# Patient Record
Sex: Male | Born: 1950 | Race: White | Hispanic: No
Health system: Southern US, Community
[De-identification: ages and names within clinical notes are randomized; demographics above are authoritative.]

## PROBLEM LIST (undated history)

## (undated) DIAGNOSIS — M810 Age-related osteoporosis without current pathological fracture: Secondary | ICD-10-CM

## (undated) DIAGNOSIS — I499 Cardiac arrhythmia, unspecified: Secondary | ICD-10-CM

## (undated) DIAGNOSIS — C4490 Unspecified malignant neoplasm of skin, unspecified: Secondary | ICD-10-CM

## (undated) HISTORY — PX: TEAR DUCT PROBING: SHX793

---

## 1990-10-06 HISTORY — PX: HERNIA REPAIR: SHX51

## 2020-06-07 ENCOUNTER — Other Ambulatory Visit: Payer: Self-pay | Admitting: Internal Medicine

## 2020-06-07 DIAGNOSIS — Z136 Encounter for screening for cardiovascular disorders: Secondary | ICD-10-CM

## 2020-06-20 ENCOUNTER — Other Ambulatory Visit: Payer: Self-pay

## 2020-06-20 ENCOUNTER — Ambulatory Visit
Admission: RE | Admit: 2020-06-20 | Discharge: 2020-06-20 | Disposition: A | Discharge status: Home / Self Care | Payer: Medicare Other | Source: Ambulatory Visit | Attending: Internal Medicine | Admitting: Internal Medicine

## 2020-06-20 DIAGNOSIS — Z7689 Persons encountering health services in other specified circumstances: Secondary | ICD-10-CM | POA: Insufficient documentation

## 2020-06-20 DIAGNOSIS — Z87891 Personal history of nicotine dependence: Secondary | ICD-10-CM | POA: Insufficient documentation

## 2020-06-20 DIAGNOSIS — Z136 Encounter for screening for cardiovascular disorders: Secondary | ICD-10-CM | POA: Diagnosis not present

## 2020-07-19 ENCOUNTER — Telehealth: Payer: Self-pay

## 2020-07-19 NOTE — Telephone Encounter (Signed)
Contacted patient for lung CT screening clinic based on referral from Dr. Nemiah Commander.  I explained program to patient and he is agreeable.  We discussed smoking history and he has only ever smoked cigars.  He started smoking cigars when he was around age 69 and smokes intermittently, more during the summer months.  During the warm months (he said 5 to 7 months) he will smoke 1 to 5 cigars a week.  During the rest of the months (the cold months) he does not smoke.  I have explained that I am not sure if he qualifies for the program as it may not encompass cigar smoking, only cigarette smoking.  I have sent my question to Glenna Fellows, lung navigator and asked him to call patient to clarify eligibility.  I have scheduled his CT scan for Nov 11 at 10:00 am and given him address to imaging center and phone number to Glenna Fellows, lung navigator.

## 2020-07-20 NOTE — Telephone Encounter (Signed)
Patient contacted and notified that current eligibility for lung screening is only for cigarette smoking history. Patient verbalizes understanding.

## 2021-10-08 IMAGING — US US ABDOMINAL AORTA SCREENING AAA
1 series · 15 of 25 positions shown · non-contrast
Comparison: None.

CLINICAL DATA: Male between 65-75 years of age with a smoking
history.

EXAM:
US ABDOMINAL AORTA MEDICARE SCREENING
TECHNIQUE: Ultrasound examination of the abdominal aorta was performed as a
screening evaluation for abdominal aortic aneurysm.

[Series 1: us abdominal aorta screening aaa · 15 of 32 slices shown]
[im 1/32]
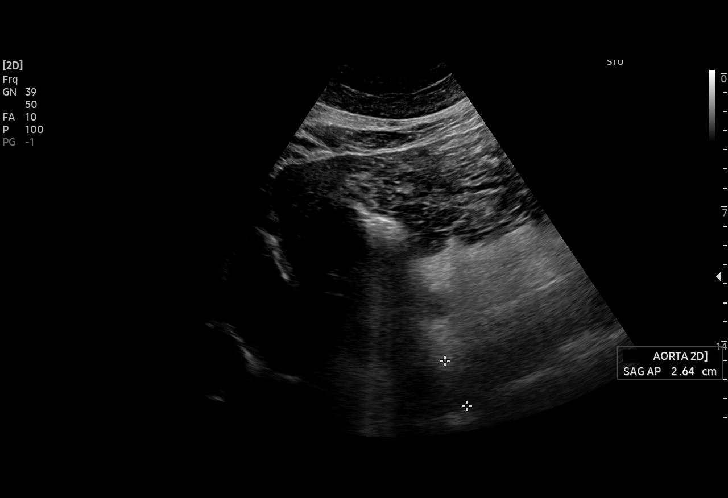
[im 3/32]
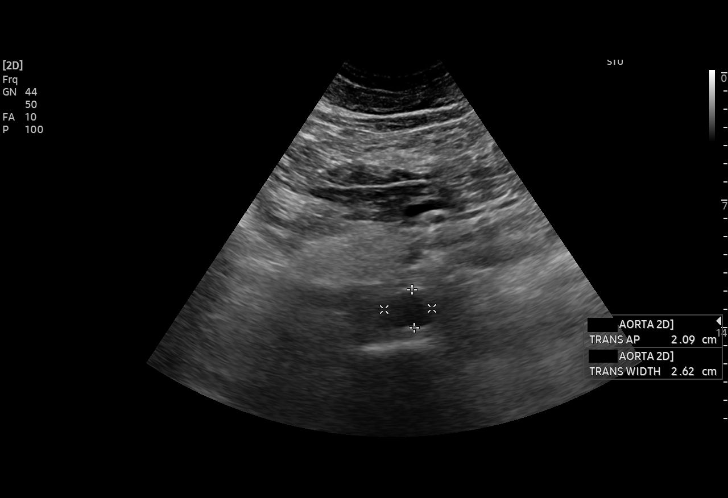
[im 6/32]
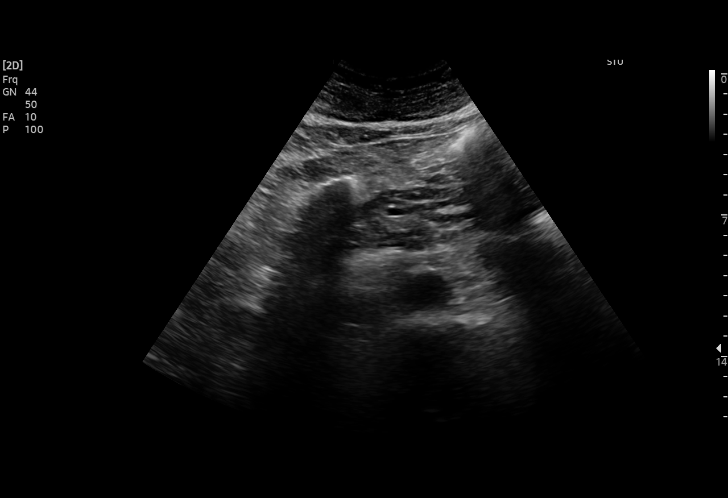
[im 7/32]
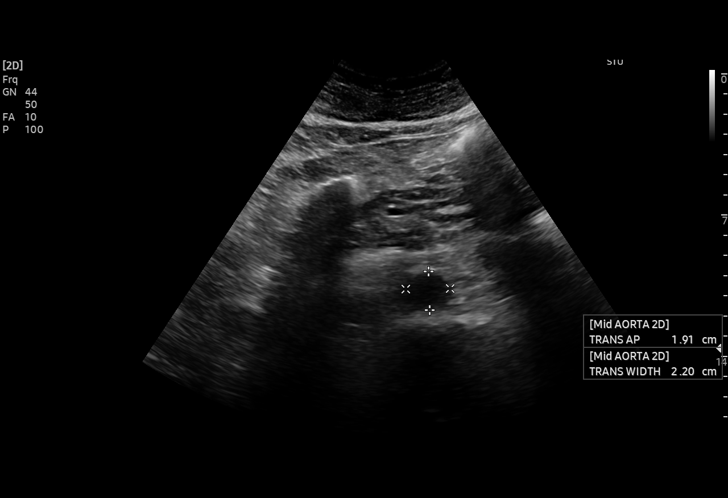
[im 10/32]
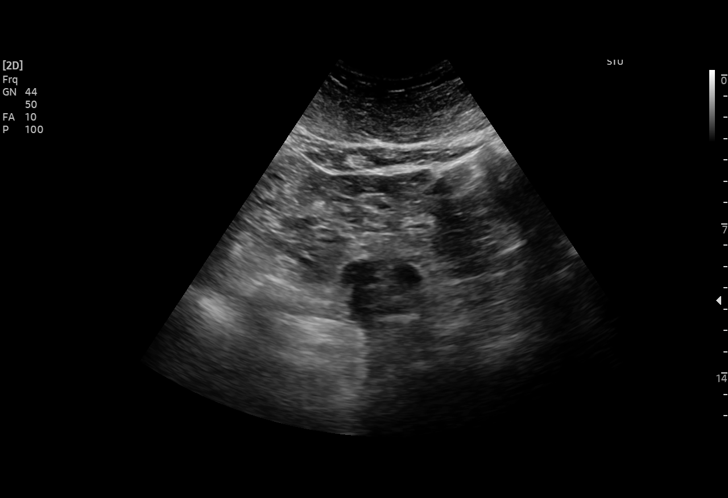
[im 12/32]
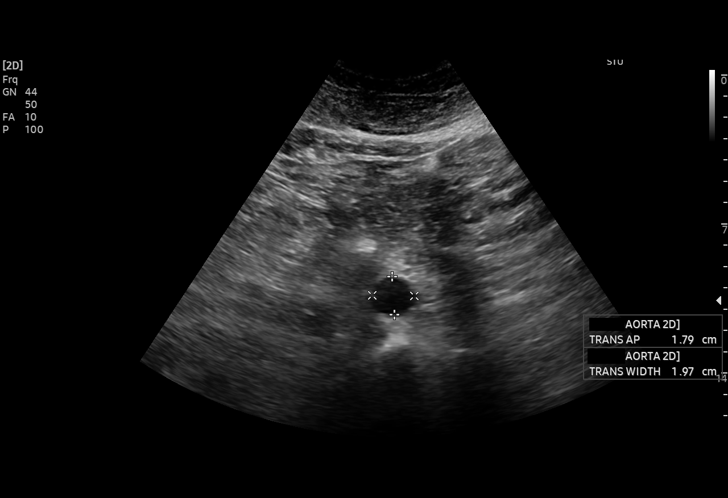
[im 13/32]
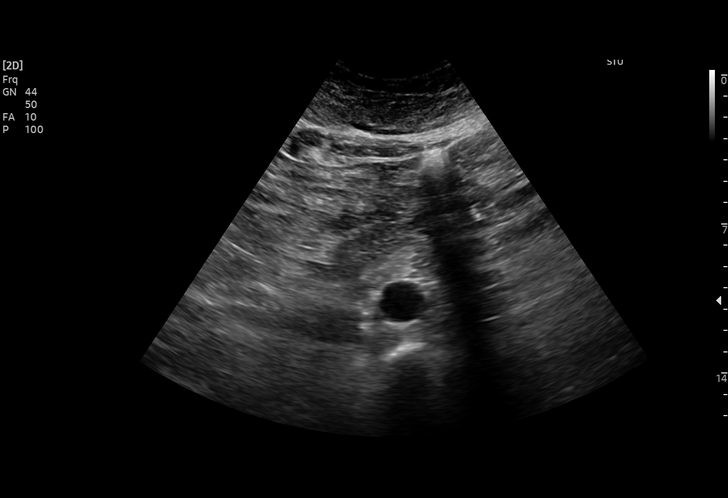
[im 16/32]
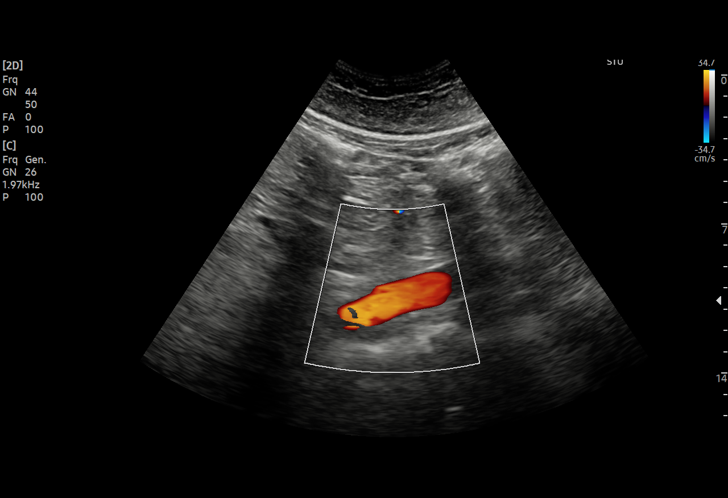
[im 19/32]
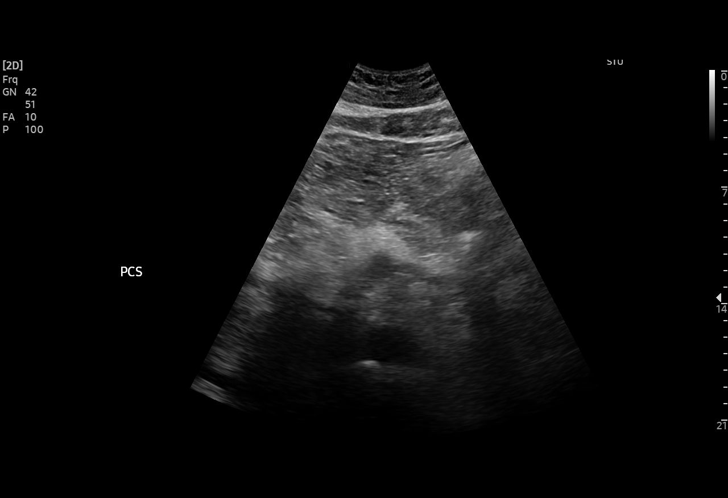
[im 20/32]
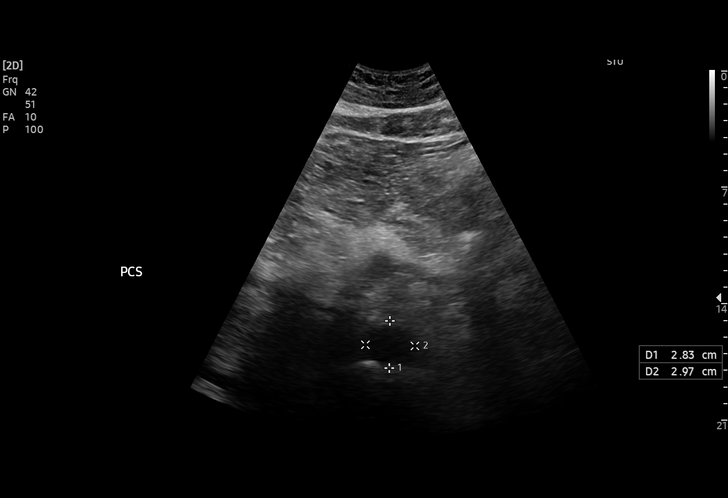
[im 22/32]
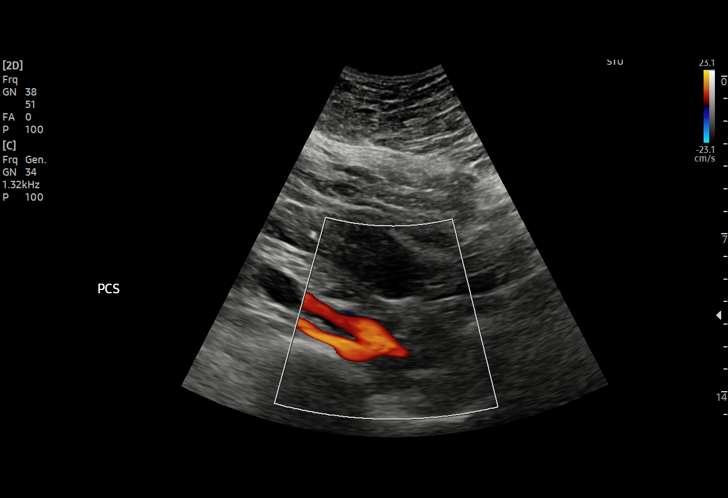
[im 25/32]
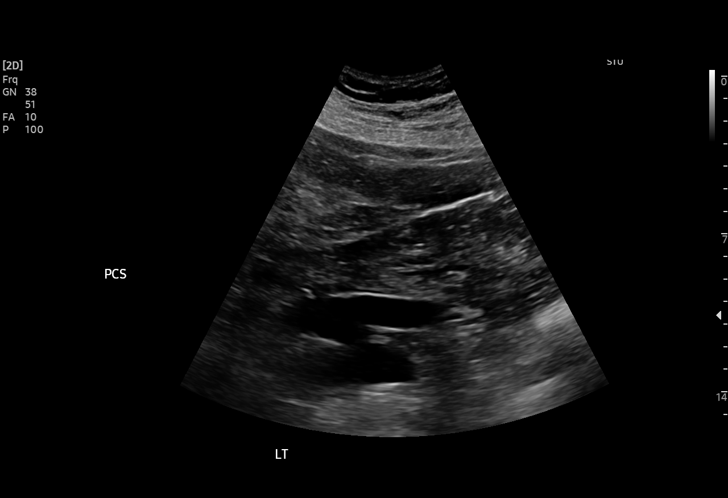
[im 26/32]
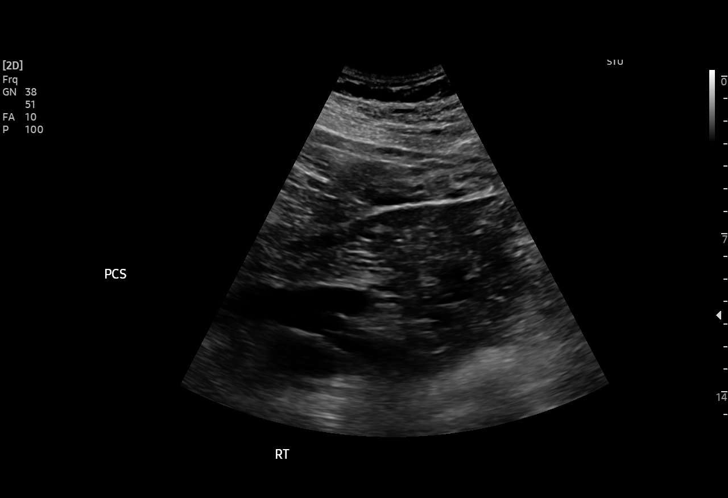
[im 29/32]
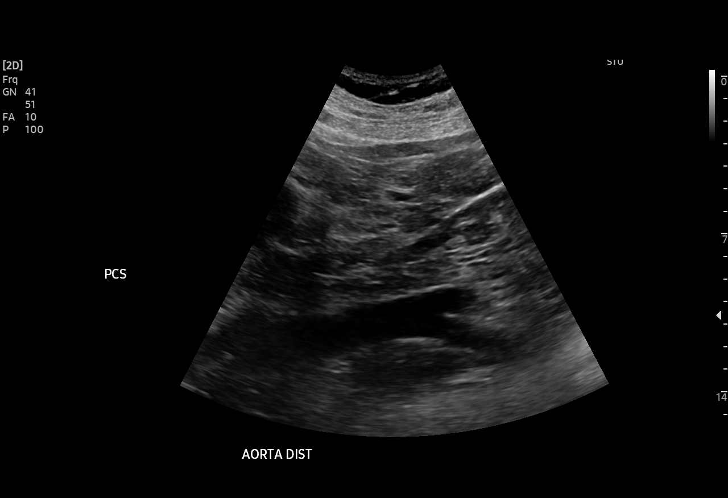
[im 32/32]
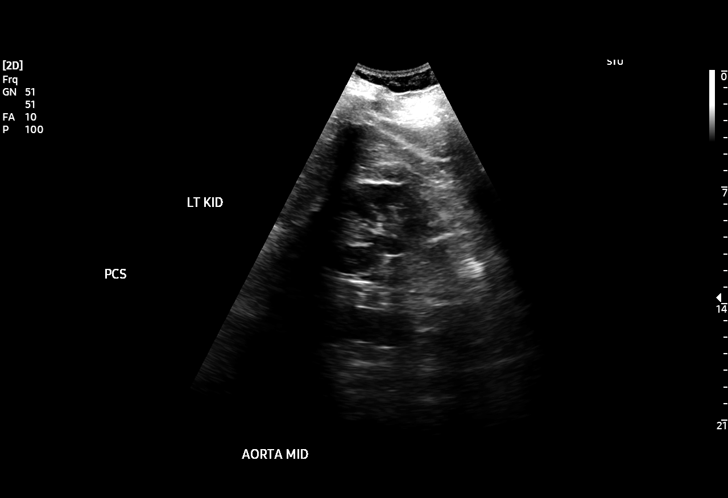

[15 of 25 positions shown; findings below may reference images not displayed]

FINDINGS: Abdominal aortic measurements as follows:

Proximal:  2.8 x 3.0 cm

Mid:  2.0 x 2.2 cm

Distal:  2.0 x 2.0 cm
IMPRESSION: 1. No evidence of abdominal aortic aneurysm.

## 2024-02-24 ENCOUNTER — Encounter: Payer: Self-pay | Admitting: Surgery

## 2024-03-02 ENCOUNTER — Ambulatory Visit: Admitting: Certified Registered"

## 2024-03-02 ENCOUNTER — Encounter
Admission: RE | Disposition: A | Discharge status: Home / Self Care | Payer: Self-pay | Source: Home / Self Care | Attending: Surgery

## 2024-03-02 ENCOUNTER — Ambulatory Visit
Admission: RE | Admit: 2024-03-02 | Discharge: 2024-03-02 | Disposition: A | Discharge status: Home / Self Care | Attending: Surgery | Admitting: Surgery

## 2024-03-02 DIAGNOSIS — I480 Paroxysmal atrial fibrillation: Secondary | ICD-10-CM | POA: Diagnosis not present

## 2024-03-02 DIAGNOSIS — K640 First degree hemorrhoids: Secondary | ICD-10-CM | POA: Diagnosis not present

## 2024-03-02 DIAGNOSIS — Z01812 Encounter for preprocedural laboratory examination: Secondary | ICD-10-CM | POA: Diagnosis not present

## 2024-03-02 DIAGNOSIS — I110 Hypertensive heart disease with heart failure: Secondary | ICD-10-CM | POA: Insufficient documentation

## 2024-03-02 DIAGNOSIS — F17290 Nicotine dependence, other tobacco product, uncomplicated: Secondary | ICD-10-CM | POA: Insufficient documentation

## 2024-03-02 DIAGNOSIS — D123 Benign neoplasm of transverse colon: Secondary | ICD-10-CM | POA: Diagnosis not present

## 2024-03-02 DIAGNOSIS — Z1211 Encounter for screening for malignant neoplasm of colon: Secondary | ICD-10-CM | POA: Insufficient documentation

## 2024-03-02 HISTORY — PX: POLYPECTOMY: SHX149

## 2024-03-02 HISTORY — PX: COLONOSCOPY: SHX5424

## 2024-03-02 HISTORY — DX: Age-related osteoporosis without current pathological fracture: M81.0

## 2024-03-02 HISTORY — DX: Unspecified malignant neoplasm of skin, unspecified: C44.90

## 2024-03-02 SURGERY — COLONOSCOPY
Anesthesia: General | Site: Abdomen

## 2024-03-02 MED ORDER — PHENYLEPHRINE 80 MCG/ML (10ML) SYRINGE FOR IV PUSH (FOR BLOOD PRESSURE SUPPORT)
PREFILLED_SYRINGE | INTRAVENOUS | Status: DC | PRN
  Administered 2024-03-02: 160 ug via INTRAVENOUS

## 2024-03-02 MED ORDER — DEXMEDETOMIDINE HCL IN NACL 200 MCG/50ML IV SOLN
INTRAVENOUS | Status: DC | PRN
Start: 2024-03-02 — End: 2024-03-02
  Administered 2024-03-02: 12 ug via INTRAVENOUS

## 2024-03-02 MED ORDER — PROPOFOL 10 MG/ML IV BOLUS
INTRAVENOUS | Status: DC | PRN
  Administered 2024-03-02: 60 mg via INTRAVENOUS

## 2024-03-02 MED ORDER — METOPROLOL TARTRATE 5 MG/5ML IV SOLN
INTRAVENOUS | Status: DC | PRN
  Administered 2024-03-02: 2 mg via INTRAVENOUS

## 2024-03-02 MED ORDER — ESMOLOL HCL 100 MG/10ML IV SOLN
INTRAVENOUS | Status: DC | PRN
  Administered 2024-03-02 (×3): 20 mg via INTRAVENOUS

## 2024-03-02 MED ORDER — PROPOFOL 500 MG/50ML IV EMUL
INTRAVENOUS | Status: DC | PRN
  Administered 2024-03-02: 155 ug/kg/min via INTRAVENOUS

## 2024-03-02 MED ORDER — SODIUM CHLORIDE 0.9 % IV SOLN
INTRAVENOUS | Status: DC
  Administered 2024-03-02: 20 mL/h via INTRAVENOUS

## 2024-03-02 MED ORDER — LIDOCAINE HCL (CARDIAC) PF 100 MG/5ML IV SOSY
PREFILLED_SYRINGE | INTRAVENOUS | Status: DC | PRN
  Administered 2024-03-02: 100 mg via INTRAVENOUS

## 2024-03-02 MED ORDER — PROPOFOL 1000 MG/100ML IV EMUL
INTRAVENOUS | Status: AC
Start: 2024-03-02 — End: ?
  Filled 2024-03-02: qty 400

## 2024-03-02 NOTE — Progress Notes (Signed)
 Cardiology here and cleared for discharge. Patient will listen for a call for cardiology appointment

## 2024-03-02 NOTE — Progress Notes (Signed)
 Afib noted on 3 lead in [facility]. Patient says he did not know he had afib. Takes beta blocker for "tremors". Dr. Jenise Mixer notified and EKG done. He will notify cardiology. Patient sisters aware.

## 2024-03-02 NOTE — Anesthesia Procedure Notes (Signed)
 Procedure Name: General with mask airway Date/Time: 03/02/2024 7:35 AM  Performed by: Niki Barter, CRNAPre-anesthesia Checklist: Patient identified, Emergency Drugs available, Suction available and Patient being monitored Patient Re-evaluated:Patient Re-evaluated prior to induction Oxygen Delivery Method: Simple face mask Induction Type: IV induction Placement Confirmation: positive ETCO2 and breath sounds checked- equal and bilateral Dental Injury: Teeth and Oropharynx as per pre-operative assessment

## 2024-03-02 NOTE — Anesthesia Postprocedure Evaluation (Signed)
 Anesthesia Post Note  Patient: Antonio Blake  Procedure(s) Performed: COLONOSCOPY (Abdomen) POLYPECTOMY, INTESTINE  Patient location during evaluation: Endoscopy Anesthesia Type: General Level of consciousness: awake and alert Pain management: pain level controlled Vital Signs Assessment: post-procedure vital signs reviewed and stable Respiratory status: spontaneous breathing, nonlabored ventilation, respiratory function stable and patient connected to nasal cannula oxygen Cardiovascular status: blood pressure returned to baseline and stable Postop Assessment: no apparent nausea or vomiting Anesthetic complications: no Comments: New A fib noted. Evaluated by cardiology with plans to follow up with them scheduled.   No notable events documented.   Last Vitals:  Vitals:   03/02/24 0812 03/02/24 0852  BP: 96/71   Pulse: (!) 110   Resp: (!) 26 (!) 22  Temp:    SpO2: 96% 98%    Last Pain:  Vitals:   03/02/24 0752  TempSrc: Temporal  PainSc: Asleep                 Portia Brittle Macy Lingenfelter

## 2024-03-02 NOTE — Consult Note (Signed)
 Cardiology Consultation   Patient ID: Antonio Blake MRN: [id number]; DOB: [birth year]  Admit date: 03/02/2024 Date of Consult: 03/02/2024  PCP:  Rex Castor, MD   [facility] Providers Cardiologist:  None      Patient Profile:   Antonio Blake is a 73 y.o. male with a hx of essential tremor and hyperlipidemia who is being seen 03/02/2024 for the evaluation of atrial fibrillation at the request of Dr. Jenise Mixer.  History of Present Illness:   Mr. Antonio Blake is a 73 year old male with no prior cardiac history.  He reports having extensive cardiac workup many years ago in [place]  after he was thought to have myocardial infarction.  However, all cardiac testing at that time came back unremarkable.  He has known history of essential tremor and has been on propranolol for that since 2006 with good control.  He has no history of essential hypertension, diabetes, heart failure or stroke.  He does have history of hyperlipidemia on rosuvastatin.  He is not aware of any previous history of arrhythmia. He is known to have internal hemorrhoids and presented for colonoscopy which was done today.  1 polyp was removed.  During the procedure, the patient was noted to be tachycardic and he was given esmolol and 1 dose of IV metoprolol.  The patient did not take his morning dose of propranolol before he came in.  An EKG was performed and confirmed atrial fibrillation.  The patient has no symptoms at this time.  No chest pain, shortness of breath or palpitations.   Past Medical History:  Diagnosis Date   Osteoporosis    Skin cancer     Past Surgical History:  Procedure Laterality Date   HERNIA REPAIR  1992   TEAR DUCT PROBING Bilateral    Surgery to build tear duct     Home Medications:  Prior to Admission medications   Medication Sig Start Date End Date Taking? Authorizing Provider  cholecalciferol (VITAMIN D3) 10 MCG (400 UNIT) TABS tablet Take 1,000 Units by mouth daily.   Yes  [provider]  diclofenac Sodium (VOLTAREN) 1 % GEL Apply topically 4 (four) times daily.   Yes [provider]  fexofenadine (ALLEGRA) 180 MG tablet Take 180 mg by mouth daily.   Yes [provider]  fluticasone (FLONASE) 50 MCG/ACT nasal spray Place into both nostrils daily.   Yes [provider]  Glucosamine-Chondroitin 250-200 MG TABS Take by mouth.   Yes [provider]  meloxicam (MOBIC) 15 MG tablet Take 15 mg by mouth daily. As needed   Yes [provider]  propranolol ER (INDERAL LA) 80 MG 24 hr capsule Take 80 mg by mouth daily.   Yes [provider]  rosuvastatin (CRESTOR) 10 MG tablet Take 10 mg by mouth daily.   Yes [provider]    Inpatient Medications: Scheduled Meds:  Continuous Infusions:  sodium chloride 20 mL/hr (03/02/24 0721)   PRN Meds:   Allergies:   Not on File  Social History:   Social History   Socioeconomic History   Marital status: Divorced    Spouse name: Not on file   Number of children: Not on file   Years of education: Not on file   Highest education level: Not on file  Occupational History   Not on file  Tobacco Use   Smoking status: Some Days    Types: Cigars   Smokeless tobacco: Never   Tobacco comments:    Very occasional  Substance and  Sexual Activity   Alcohol use: Yes    Comment: very occasional   Drug use: Never   Sexual activity: Not on file  Other Topics Concern   Not on file  Social History Narrative   Not on file   Social Drivers of Health   Financial Resource Strain: Low Risk  (02/01/2024)   Received from [facility]   Overall Financial Resource Strain (CARDIA)    Difficulty of Paying Living Expenses: Not hard at all  Food Insecurity: No Food Insecurity (02/01/2024)   Received from [facility]   Hunger Vital Sign    Worried About Running Out of Food in the Last Year: Never true    Ran Out of Food in the  Last Year: Never true  Transportation Needs: No Transportation Needs (02/01/2024)   Received from [facility] - Transportation    In the past 12 months, has lack of transportation kept you from medical appointments or from getting medications?: No    Lack of Transportation (Non-Medical): No  Physical Activity: Not on file  Stress: Not on file  Social Connections: Not on file  Intimate Partner Violence: Not on file    Family History:   History reviewed. No pertinent family history.   ROS:  Please see the history of present illness.   All other ROS reviewed and negative.     Physical Exam/Data:   Vitals:   03/02/24 0759 03/02/24 0802 03/02/24 0812 03/02/24 0852  BP: 91/65 (!) 98/57 96/71   Pulse: 94 (!) 102 (!) 110   Resp: 18 16 (!) 26 (!) 22  Temp:      TempSrc:      SpO2: 100% 97% 96% 98%  Weight:      Height:        Intake/Output Summary (Last 24 hours) at 03/02/2024 0911 Last data filed at 03/02/2024 0748 Gross per 24 hour  Intake 300 ml  Output --  Net 300 ml      03/02/2024    7:09 AM  Last 3 Weights  Weight (lbs) 272 lb 12.8 oz  Weight (kg) 123.741 kg     Body mass index is 35.99 kg/m.  General:  Well nourished, well developed, in no acute distress HEENT: normal Neck: no JVD Vascular: No carotid bruits; Distal pulses 2+ bilaterally Cardiac:  normal S1, S2; irregularly irregular and mildly tachycardic; no murmur  Lungs:  clear to auscultation bilaterally, no wheezing, rhonchi or rales  Abd: soft, nontender, no hepatomegaly  Ext: no edema Musculoskeletal:  No deformities, BUE and BLE strength normal and equal Skin: warm and dry  Neuro:  CNs 2-12 intact, no focal abnormalities noted Psych:  Normal affect   EKG:  The EKG was personally reviewed and demonstrates: Atrial fibrillation with rapid ventricular response. Telemetry:  Telemetry was personally reviewed and demonstrates: Atrial fibrillation  Relevant CV  Studies:   Laboratory Data:  High Sensitivity Troponin:  No results for input(s): "TROPONINIHS" in the last 720 hours.   ChemistryNo results for input(s): "NA", "K", "CL", "CO2", "GLUCOSE", "BUN", "CREATININE", "CALCIUM", "MG", "GFRNONAA", "GFRAA", "ANIONGAP" in the last 168 hours.  No results for input(s): "PROT", "ALBUMIN", "AST", "ALT", "ALKPHOS", "BILITOT" in the last 168 hours. Lipids No results for input(s): "CHOL", "TRIG", "HDL", "LABVLDL", "LDLCALC", "CHOLHDL" in the last 168 hours.  HematologyNo results for input(s): "WBC", "RBC", "HGB", "HCT", "MCV", "MCH", "MCHC", "RDW", "PLT" in the last 168 hours. Thyroid No results for input(s): "TSH", "FREET4"  in the last 168 hours.  BNPNo results for input(s): "BNP", "PROBNP" in the last 168 hours.  DDimer No results for input(s): "DDIMER" in the last 168 hours.   Radiology/Studies:  No results found.   Assessment and Plan:   Paroxysmal atrial fibrillation: This was noted today during colonoscopy: This is his first documented episode.  His chads Vascor is 1.  No need to start anticoagulation at this time especially that he had 1 polyp removed today.  I will arrange for him to follow-up in our office on Friday and we will obtain an EKG at that time.  If he remains in atrial fibrillation, we will plan on starting him on Eliquis and proceeding with cardioversion after 3 to 4 weeks of anticoagulation.  I will also arrange for an echocardiogram.  I asked him to continue taking propranolol which she is on for essential tremor.  This should help with rate control.  He seems to be asymptomatic from an atrial fibrillation standpoint. Hyperlipidemia: Currently on rosuvastatin. Essential tremor: Continue propranolol.   Risk Assessment/Risk Scores:          CHA2DS2-VASc Score = 1   This indicates a 0.6% annual risk of stroke. The patient's score is based upon: CHF History: 0 HTN History: 0 Diabetes History: 0 Stroke History: 0 Vascular  Disease History: 0 Age Score: 1 Gender Score: 0       For questions or updates, please contact [facility] Please consult [email or web address] for contact info under    Signed, Antionette Kirks, MD  03/02/2024 9:11 AM

## 2024-03-02 NOTE — Transfer of Care (Signed)
 Immediate Anesthesia Transfer of Care Note  Patient: Antonio Blake  Procedure(s) Performed: COLONOSCOPY (Abdomen) POLYPECTOMY, INTESTINE  Patient Location: Endoscopy Unit  Anesthesia Type:General  Level of Consciousness: drowsy and patient cooperative  Airway & Oxygen Therapy: Patient Spontanous Breathing and Patient connected to face mask oxygen  Post-op Assessment: Report given to RN and Post -op Vital signs reviewed and stable  Post vital signs: Reviewed and stable  Last Vitals:  Vitals Value Taken Time  BP 91/65 03/02/24 0758  Temp 35.7 C 03/02/24 0752  Pulse 106 03/02/24 0759  Resp 18 03/02/24 0759  SpO2 100 % 03/02/24 0759  Vitals shown include unfiled device data.  Last Pain:  Vitals:   03/02/24 0752  TempSrc: Temporal  PainSc: Asleep         Complications: No notable events documented.

## 2024-03-02 NOTE — Op Note (Signed)
 [facility] Gastroenterology Patient Name: Darean Rote Procedure Date: 03/02/2024 7:23 AM MRN: [id number] Account #: [id number] Date of Birth: 27-Aug-1951 Admit Type: Outpatient Age: 73 Room: [facility] ENDO ROOM 1 Gender: Male Note Status: Finalized Instrument Name: Charlyn Cooley [id number] Procedure:             Colonoscopy Indications:           High risk colon cancer surveillance: Personal history                         of colonic polyps Providers:             Conrado Delay MD, MD Medicines:             Propofol per Anesthesia Complications:         No immediate complications. Procedure:             Pre-Anesthesia Assessment:                        - After reviewing the risks and benefits, the patient                         was deemed in satisfactory condition to undergo the                         procedure in an ambulatory setting.                        After obtaining informed consent, the colonoscope was                         passed under direct vision. Throughout the procedure,                         the patient's blood pressure, pulse, and oxygen                         saturations were monitored continuously. The                         Colonoscope was introduced through the anus and                         advanced to the the cecum, identified by the ileocecal                         valve. The colonoscopy was performed without                         difficulty. The patient tolerated the procedure well.                         The quality of the bowel preparation was good. Findings:      The perianal and digital rectal examinations were normal.      A 4 mm polyp was found in the proximal transverse colon. The polyp was       sessile. This was biopsied with a cold jumbo forceps for histology.       Estimated blood loss was minimal.      Non-bleeding internal  hemorrhoids were found during retroflexion. The       hemorrhoids were Grade I (internal hemorrhoids  that do not prolapse). Impression:            - One 4 mm polyp in the proximal transverse colon.                         Biopsied.                        - Non-bleeding internal hemorrhoids. Recommendation:        - Await pathology results.                        - Written discharge instructions were provided to the                         patient.                        - Discharge patient to home.                        - Resume previous diet. Procedure Code(s):     --- Professional ---                        [phone], Colonoscopy, flexible; with biopsy, single or                         multiple Diagnosis Code(s):     --- Professional ---                        K64.0, First degree hemorrhoids                        D12.3, Benign neoplasm of transverse colon (hepatic                         flexure or splenic flexure)                        Z86.010, Personal history of colonic polyps CPT copyright 2022 American Medical Association. All rights reserved. The codes documented in this report are preliminary and upon coder review may  be revised to meet current compliance requirements. Dr. Ward Guy, MD Conrado Delay MD, MD 03/02/2024 7:53:00 AM This report has been signed electronically. Number of Addenda: 0 Note Initiated On: 03/02/2024 7:23 AM Scope Withdrawal Time: 0 hours 8 minutes 11 seconds  Total Procedure Duration: 0 hours 13 minutes 5 seconds  Estimated Blood Loss:  Estimated blood loss was minimal.      [facility]

## 2024-03-02 NOTE — Anesthesia Preprocedure Evaluation (Signed)
 Anesthesia Evaluation  Patient identified by MRN, date of birth, ID band Patient awake    Reviewed: Allergy & Precautions, NPO status , Patient's Chart, lab work & pertinent test results  History of Anesthesia Complications Negative for: history of anesthetic complications  Airway Mallampati: III  TM Distance: <3 FB Neck ROM: full    Dental  (+) Chipped, Poor Dentition Patient reports a broken bridge that needs repair:   Pulmonary neg shortness of breath, Current Smoker   Pulmonary exam normal        Cardiovascular (-) angina negative cardio ROS Normal cardiovascular exam     Neuro/Psych negative neurological ROS  negative psych ROS   GI/Hepatic negative GI ROS, Neg liver ROS,neg GERD  ,,  Endo/Other  negative endocrine ROS    Renal/GU negative Renal ROS  negative genitourinary   Musculoskeletal   Abdominal   Peds  Hematology negative hematology ROS (+)   Anesthesia Other Findings Past Medical History: No date: Osteoporosis No date: Skin cancer  Past Surgical History: 1992: HERNIA REPAIR No date: TEAR DUCT PROBING; Bilateral     Comment:  Surgery to build tear duct  BMI    Body Mass Index: 35.99 kg/m      Reproductive/Obstetrics negative OB ROS                             Anesthesia Physical Anesthesia Plan  ASA: 2  Anesthesia Plan: General   Post-op Pain Management:    Induction: Intravenous  PONV Risk Score and Plan: Propofol  infusion and TIVA  Airway Management Planned: Natural Airway and Nasal Cannula  Additional Equipment:   Intra-op Plan:   Post-operative Plan:   Informed Consent: I have reviewed the patients History and Physical, chart, labs and discussed the procedure including the risks, benefits and alternatives for the proposed anesthesia with the patient or authorized representative who has indicated his/her understanding and acceptance.     Dental  Advisory Given  Plan Discussed with: Anesthesiologist, CRNA and Surgeon  Anesthesia Plan Comments: (Patient consented for risks of anesthesia including but not limited to:  - adverse reactions to medications - risk of airway placement if required - damage to eyes, teeth, lips or other oral mucosa - nerve damage due to positioning  - sore throat or hoarseness - Damage to heart, brain, nerves, lungs, other parts of body or loss of life  Patient voiced understanding and assent.)       Anesthesia Quick Evaluation

## 2024-03-02 NOTE — H&P (Signed)
 Subjective:  CC: Hemorrhoids  HPI: Antonio Blake is a 73 y.o. male who was referrred by Rex Castor, MD for above. Symptoms were first noted about one year ago. he has some rectal bleeding occurring several times per week. Bleeding is described as bright red, on toilet paper and sometimes on toilet bowl . Patient reports discomfort after wiping. He has been using HC 2.5% cream for pain which has help. Has tried using wet wipes but has recently switched to wetting toilet paper. His last colonoscopy was 5 years ago where internal hemorrhoids and polyps were detected..   Toilet habits: wetting toilet paper   Patient reports to history of polyps. Last colonoscopy in October 2020, recommended to return in 5 years. Patient is due for colonoscopy this year.   Past Medical History: has a past medical history of Osteoporosis and Skin cancer.  Past Surgical History: has a past surgical history that includes Colonoscopy (07/2019); tear duct (Bilateral); and Hernia repair (1992).  Family History: family history includes Diabetes type II in his sister; Leukemia in his brother and sister; No Known Problems in his father and mother.  Social History: reports that he has been smoking cigars. He has never used smokeless tobacco. He reports that he does not currently use alcohol. He reports that he does not use drugs.  Current Medications: has a current medication list which includes the following prescription(s): cholecalciferol, diclofenac, fluticasone propionate, meloxicam, propranolol, and rosuvastatin.  Allergies:  Allergies as of 02/01/2024  (No Known Allergies)   ROS:  A 15 point review of systems was performed and pertinent positives and negatives noted in HPI  Objective:    BP 112/78  Pulse 97  Ht 185.4 cm (6\' 1" )  Wt (!) 128.4 kg (283 lb)  BMI 37.34 kg/m   Constitutional : No distress, cooperative, alert  Gastrointestinal: Soft, non-tender, non-distended, no organomegaly.   Musculoskeletal: Steady gait and movement  Skin: Cool and moist  Psychiatric: Normal affect, non-agitated, not confused  Rectal: Internal hemorrhoids noted, reported pain with anoscopy. Chaperone present for exam. External exam noted to have several external hemorrhoids, with one larger one that is non-tender, no ulceration. DRE revealed, normal rectal tone, with palpable internal hemorrhoid noted at bilateral portion with minimal discomfort. After obtaining verbal consent, an anoscope was inserted after prepped with lubricant and internal hemorrhoids noted on DRE confirmed visually, with no evidence of thrombosis. No other pathology such as fissures, fistulas, polyps noted. Scope withdrawn and patient tolerate procedure well.    LABS:  No labs.   RADS: No imaging.   Assessment:   Internal Hemorrhoids (K64.0): Internal hemorrhoids grade 1-based on clinical history and physical exam. Because patient reported pain with anoscopy, rubber band ligation is not recommended. Discussed hemorrhoidectomy as a treatment option. Also discussed other conservative treatment options: wet wipes, bidet, and less straining to decrease irritation.   Personal history of colon polyps (Z86.010): Last colonoscopy, October 2020. Due for coloscopy this year.   Plan:   1. Internal Hemorrhoids (K64.0): Discussed risks/benefits/alternatives to surgery. Alternatives include the options of observation, medical management. Patient would like to proceed with conservative treatment for now. Recommended the following: wet wipes, bidet, and less straining to decrease irritation. Continue using HC 2.5% cream as needed. If symptoms worsen in the future, can revisit surgery discussion. Follow up PRN.   ED return precautions given for sudden increase in pain, bleeding, with possible accompanying fever, nausea, and/or vomiting.  The patient understands the risks, any and all questions were  answered to the patient's  satisfaction.  2. Personal history of colon polyps (Z86.010): Will schedule colonoscopy today.

## 2024-03-02 NOTE — Interval H&P Note (Signed)
 History and Physical Interval Note:  03/02/2024 7:28 AM  Antonio Blake  has presented today for surgery, with the diagnosis of Z86.0100  Hx of colon polyps.  The various methods of treatment have been discussed with the patient and family. After consideration of risks, benefits and other options for treatment, the patient has consented to  Procedure(s): COLONOSCOPY (N/A) as a surgical intervention.  The patient's history has been reviewed, patient examined, no change in status, stable for surgery.  I have reviewed the patient's chart and labs.  Questions were answered to the patient's satisfaction.     Salimata Christenson Rosea Conch

## 2024-03-02 NOTE — Op Note (Signed)
 Preoperative diagnosis: axillary abscess Postoperative diagnosis: same  Procedure: Incision and drainage of axillary abscess, right  Anesthesia: MAC  Surgeon: Rosea Conch  Wound Classification: Contaminated  Indications: Patient is a 73 y.o. male  presented with above.  See H&P for further details.  Specimen: axillary abscess culture  Complications: None  Estimated Blood Loss: 10mL  Findings:  1. Infected epidermal cyst turned abscess 2. purulent secretions drained and cultured 3. Adequate hemostasis.  4. Majority of capsule removed  Description of procedure: The patient was placed in the supine position and MAC anesthesia was induced. The area was prepped and draped in the usual sterile fashion. A timeout was completed verifying correct patient, procedure, site, positioning, and implant(s) and/or special equipment prior to beginning this procedure.  Previously made incision in office extended and additional purulent secretions was drained. Cultures taken.  With a hemostat blunt dissection of septas performed to drain the abscess completely. Inspection of wound noted cyst capsule consistent with infected epidermal cyst.  Visualized capsule portions removed in piecemeal fashion. The wound then irrigated, hemostasis confirmed and then packed with an iodine packing, dressed with abd pads and secured with paper tape.   The patient tolerated the procedure well and was taken to the postanesthesia care unit in satisfactory condition.

## 2024-03-03 ENCOUNTER — Encounter: Payer: Self-pay | Admitting: Surgery

## 2024-03-03 LAB — SURGICAL PATHOLOGY

## 2024-03-04 ENCOUNTER — Ambulatory Visit: Attending: Cardiology | Admitting: Cardiology

## 2024-03-04 ENCOUNTER — Telehealth: Payer: Self-pay | Admitting: *Deleted

## 2024-03-04 ENCOUNTER — Encounter: Payer: Self-pay | Admitting: Cardiology

## 2024-03-04 VITALS — BP 110/68 | HR 98 | Ht 72.0 in | Wt 280.0 lb

## 2024-03-04 DIAGNOSIS — G250 Essential tremor: Secondary | ICD-10-CM | POA: Insufficient documentation

## 2024-03-04 DIAGNOSIS — E782 Mixed hyperlipidemia: Secondary | ICD-10-CM | POA: Diagnosis present

## 2024-03-04 DIAGNOSIS — I4891 Unspecified atrial fibrillation: Secondary | ICD-10-CM

## 2024-03-04 MED ORDER — APIXABAN 2.5 MG PO TABS
2.5000 mg | ORAL_TABLET | Freq: Two times a day (BID) | ORAL | 3 refills | Status: DC
Start: 2024-03-04 — End: 2024-03-04

## 2024-03-04 MED ORDER — APIXABAN 5 MG PO TABS
5.0000 mg | ORAL_TABLET | Freq: Two times a day (BID) | ORAL | 3 refills | Status: DC
Start: 2024-03-04 — End: 2024-03-11

## 2024-03-04 MED ORDER — APIXABAN 5 MG PO TABS: 5.0000 mg | ORAL_TABLET | Freq: Two times a day (BID) | ORAL | Status: DC

## 2024-03-04 MED ORDER — APIXABAN 2.5 MG PO TABS
2.5000 mg | ORAL_TABLET | Freq: Two times a day (BID) | ORAL | Status: DC
Start: 2024-03-04 — End: 2024-03-04

## 2024-03-04 NOTE — Telephone Encounter (Signed)
 Patient added to APP schedule today, per Dr. Alvenia Aus, for post colonoscopy AFIB. ECHO has been ordered as well. Patient given arrival time and directions.

## 2024-03-04 NOTE — Progress Notes (Signed)
 Cardiology Office Note   Date:  03/06/2024  ID:  Antonio Blake, DOB 10-Oct-1950, MRN [id number] PCP: Rex Castor, MD  [facility] Providers Cardiologist:  None     History of Present Illness Antonio Blake is a 73 y.o. male with a past medical history of essential tremor, hyperlipidemia, osteoporosis, skin cancer, and internal hemorrhoids, who is here today for follow-up after recent onset of atrial fibrillation.   Mr. [name] reported no prior cardiac history.  He reported had an extensive cardiac workup many years ago in [place]  after he was thought to have a myocardial infarction.  All cardiac testing at that time came back unremarkable per the patient.  He has known history of essential tremor has been on propranolol for that since around 2006 with good control.  He had no history of hypertension, diabetes, heart failure or stroke.  He does have a longstanding history of hyperlipidemia on rosuvastatin.  He is not aware of any history of an arrhythmia.   He has a known history of internal hemorrhoids and presented for colonoscopy which was done on 03/02/2024.  1 polyp was removed.  To the procedure the patient was noted to be tachycardic and he was given esmolol  and 1 dose of IV metoprolol .  The patient did not take his morning dose of propranolol before he came in.  An EKG was performed and confirmed that he was in atrial fibrillation.  He continued to remain asymptomatic with no chest pain shortness of breath or palpitations.  He returns to clinic today stating that overall that he has been doing well.  He denies any chest pain, palpitations, lightheadedness or dizziness.  He does have shortness of breath with minimal exertion and mild swelling to his bilateral lower extremities.  He states that he had started propranolol when he was still in the [employer] to control a tremor and is controlled well.  He has never been told that he had had atrial fibrillation or any other cardiac arrhythmia.   He states that he has been compliant with his current medication with any undue side effects.  ROS: 10 point review of systems has been reviewed and considered negative except ones been listed in HPI  Studies Reviewed EKG Interpretation Date/Time:  Friday Mar 04 2024 15:07:47 EDT Ventricular Rate:  98 PR Interval:    QRS Duration:  80 QT Interval:  322 QTC Calculation: 411 R Axis:   -4  Text Interpretation: Atrial fibrillation When compared with ECG of 02-Mar-2024 08:16, No significant change was found Confirmed by Ronald Cockayne ([id number]) on 03/04/2024 3:13:13 PM    Echocardiogram ordered and pending Risk Assessment/Calculations  CHA2DS2-VASc Score = 1   This indicates a 0.6% annual risk of stroke. The patient's score is based upon: CHF History: 0 HTN History: 0 Diabetes History: 0 Stroke History: 0 Vascular Disease History: 0 Age Score: 1 Gender Score: 0         Physical Exam VS:  BP 110/68 (BP Location: Left Arm, Patient Position: Sitting, Cuff Size: Large)   Pulse 98   Ht 6' (1.829 m)   Wt 280 lb (127 kg)   SpO2 96%   BMI 37.97 kg/m    Wt Readings from Last 3 Encounters:  03/04/24 280 lb (127 kg)  03/02/24 272 lb 12.8 oz (123.7 kg)    GEN: Well nourished, well developed in no acute distress NECK: No JVD; No carotid bruits CARDIAC: IR IR, no murmurs, rubs, gallops RESPIRATORY:  Clear to auscultation without rales, wheezing  or rhonchi  ABDOMEN: Soft, non-tender, non-distended EXTREMITIES: Trace pretibial edema; No deformity, venous discoloration noted to the bilateral lower extremities.  ASSESSMENT AND PLAN Paroxysmal atrial fibrillation that was incidentally found during recent gastroenterology procedure.  He returns today EKG revealing continued rate controlled atrial fibrillation.  He has been started on apixaban  5 mg twice daily for CHA2DS2-VASc score of 1 for stroke prophylaxis after discussing safety of starting apixaban  after recent procedure with primary  cardiologist Dr Alvenia Aus and Dr Rosea Conch who completed his procedure.  He will stay on uninterrupted anticoagulation for minimum of 3 to 4 weeks and will return to the office for a repeat EKG to determine if he continues to remain in atrial fibrillation and if so at that time he will be scheduled for direct-current cardioversion procedure.  He has been maintained on his propranolol 80 mg daily at this time as he has been rate controlled has been on propranolol long-term for his tremor.  He has also been scheduled for an echocardiogram.  Mixed hyperlipidemia with last LDL of 73.  He is continued on rosuvastatin 10 mg daily.  Ongoing management per his PCP.  Essential tremor that has been effectively managed with propranolol 80 mg daily.  He has been continued on this medication at this time.       Dispo: Patient to return to clinic in 3 to 4 weeks for reevaluation of new medications and new onset atrial fibrillation or sooner if needed  Signed, Lineth Thielke, NP

## 2024-03-04 NOTE — Patient Instructions (Addendum)
 Medication Instructions:  Your physician recommends the following medication changes.   START TAKING: Eliquis 5 mg every 12 hours   *If you need a refill on your cardiac medications before your next appointment, please call your pharmacy*  Lab Work: No labs ordered today  If you have labs (blood work) drawn today and your tests are completely normal, you will receive your results only by: MyChart Message (if you have MyChart) OR A paper copy in the mail If you have any lab test that is abnormal or we need to change your treatment, we will call you to review the results.  Testing/Procedures: ECHO  Your physician has requested that you have an echocardiogram. Echocardiography is a painless test that uses sound waves to create images of your heart. It provides your doctor with information about the size and shape of your heart and how well your heart's chambers and valves are working.   You may receive an ultrasound enhancing agent through an IV if needed to better visualize your heart during the echo. This procedure takes approximately one hour.  There are no restrictions for this procedure.  This will take place at [address] [facility]) [address], [place] [address]  Please note: We ask at that you not bring children with you during ultrasound (echo/ vascular) testing. Due to room size and safety concerns, children are not allowed in the ultrasound rooms during exams. Our front office staff cannot provide observation of children in our lobby area while testing is being conducted. An adult accompanying a patient to their appointment will only be allowed in the ultrasound room at the discretion of the ultrasound technician under special circumstances. We apologize for any inconvenience.   Follow-Up: At [facility], you and your health needs are our priority.  As part of our continuing mission to provide you with exceptional heart care, our providers are all part  of one team.  This team includes your primary Cardiologist (physician) and Advanced Practice Providers or APPs (Physician Assistants and Nurse Practitioners) who all work together to provide you with the care you need, when you need it.  Your next appointment:   3 - 4 week(s)  Provider:   Antionette Kirks, MD or Ronald Cockayne, NP

## 2024-03-06 ENCOUNTER — Encounter: Payer: Self-pay | Admitting: Cardiology

## 2024-03-07 ENCOUNTER — Ambulatory Visit: Attending: Cardiovascular Disease

## 2024-03-07 DIAGNOSIS — I4891 Unspecified atrial fibrillation: Secondary | ICD-10-CM | POA: Diagnosis present

## 2024-03-07 LAB — ECHOCARDIOGRAM COMPLETE
AR max vel: 4.51 cm2
AV Area VTI: 4.21 cm2
AV Area mean vel: 4.46 cm2
AV Mean grad: 2.7 mmHg
AV Peak grad: 4.7 mmHg
Ao pk vel: 1.08 m/s
S' Lateral: 2.85 cm

## 2024-03-09 ENCOUNTER — Ambulatory Visit: Payer: Self-pay | Admitting: Cardiovascular Disease

## 2024-03-11 ENCOUNTER — Other Ambulatory Visit: Payer: Self-pay

## 2024-03-11 ENCOUNTER — Telehealth: Payer: Self-pay | Admitting: Cardiology

## 2024-03-11 MED ORDER — APIXABAN 5 MG PO TABS: 5.0000 mg | ORAL_TABLET | Freq: Two times a day (BID) | ORAL | 3 refills | Status: DC

## 2024-03-11 NOTE — Telephone Encounter (Signed)
*  STAT* If patient is at the pharmacy, call can be transferred to refill team.   1. Which medications need to be refilled? (please list name of each medication and dose if known)           apixaban  (ELIQUIS ) 5 MG TABS tablet     2. Which pharmacy/location (including street and city if local pharmacy) is medication to be sent to? [facility] - [place], [place] - [address]    3. Do they need a 30 day or 90 day supply?   90 day supply  Patient says [facility] informed him they are missing information and they need a new prescription. He says they didn't specify what's needed, but he is hopeful to have this resolved before the weekend if at all possible.

## 2024-03-17 ENCOUNTER — Other Ambulatory Visit: Payer: Self-pay | Admitting: *Deleted

## 2024-03-17 MED ORDER — APIXABAN 5 MG PO TABS: 5.0000 mg | ORAL_TABLET | Freq: Two times a day (BID) | ORAL | 0 refills | Status: AC

## 2024-03-21 ENCOUNTER — Telehealth: Payer: Self-pay | Admitting: Cardiovascular Disease

## 2024-03-21 NOTE — Telephone Encounter (Signed)
 Called Maddie at [facility], requested she fax a surgical clearance for patient's procedure on Thursday, 03/24/24

## 2024-03-21 NOTE — Telephone Encounter (Signed)
 Did not mean to call patient - needed dental office for surgical clearance

## 2024-03-21 NOTE — Telephone Encounter (Signed)
 Caller stated patient is having 1 tooth extracted but has recently been put on Eliquis .  Caller is concerned if the patient will need to stop his Eliquis  if this will affect his evaluation on this medication.

## 2024-03-21 NOTE — Telephone Encounter (Signed)
 Patient was returning call. Please advise ?

## 2024-03-22 ENCOUNTER — Telehealth: Payer: Self-pay | Admitting: Cardiovascular Disease

## 2024-03-22 NOTE — Telephone Encounter (Signed)
 Certain information may not be available or accurate in this report, including over-the-counter medications, low cost prescriptions, prescriptions paid for by the patient or non-participating sources, or errors in insurance claims information. The provider should independently verify medication history with the patient.          Pre-operative Risk Assessment    Patient Name: Antonio Blake  DOB: October 08, 1950 MRN: [id number]   Date of last office visit: unknown Date of next office visit: 03-29-24  Request for Surgical Clearance    Procedure:  Dental Extraction - Amount of Teeth to be Pulled:  1  Date of Surgery:  Clearance 03/24/24                                Surgeon:  unknown  Surgeon's Groupr Practice Name:  [facility] Phone number:  [phone] Fax number:  [phone]   Type of Clearance Requested:   - Pharmacy:  Hold Apixaban  (Eliquis ) holder eliguis prior, order te resume after   Type of Anesthesia:  Local    Additional requests/questions:    Antonio Blake   03/22/2024, 3:20 PM

## 2024-03-22 NOTE — Telephone Encounter (Signed)
 Patient called to follow-up on next steps regarding his medication and stated he has not taken any Eliquis  as yet this morning.

## 2024-03-22 NOTE — Telephone Encounter (Signed)
   Patient Name: Antonio Blake  DOB: Apr 06, 1951 MRN: [id number]  Primary Cardiologist: None  Chart reviewed as part of pre-operative protocol coverage.   Dental extractions of 1-2 teeth are considered low risk procedures per guidelines and generally do not require any specific cardiac clearance. It is also generally accepted that for extractions of 1-2 teeth and dental cleanings, there is no need to interrupt blood thinner therapy.  SBE prophylaxis is not required for the patient from a cardiac standpoint.  I will route this recommendation to the requesting party via Epic fax function and remove from pre-op pool.  Please call with questions.  Morey Ar, NP 03/22/2024, 3:46 PM

## 2024-03-23 NOTE — Telephone Encounter (Signed)
 Office is calling to check on clearance. Pt is coming in tomorrow for procedure.

## 2024-03-29 ENCOUNTER — Encounter: Payer: Self-pay | Admitting: Cardiology

## 2024-03-29 ENCOUNTER — Ambulatory Visit: Attending: Cardiology | Admitting: Cardiology

## 2024-03-29 VITALS — BP 114/72 | HR 104 | Ht 73.0 in | Wt 275.0 lb

## 2024-03-29 DIAGNOSIS — I4891 Unspecified atrial fibrillation: Secondary | ICD-10-CM | POA: Insufficient documentation

## 2024-03-29 DIAGNOSIS — G250 Essential tremor: Secondary | ICD-10-CM | POA: Insufficient documentation

## 2024-03-29 DIAGNOSIS — E782 Mixed hyperlipidemia: Secondary | ICD-10-CM | POA: Diagnosis not present

## 2024-03-29 NOTE — Progress Notes (Signed)
 Cardiology Office Note   Date:  03/29/2024  ID:  Teng Decou, DOB 09-11-1951, MRN [id number] PCP: Sherial Bail, MD  [facility] Providers Cardiologist:  None     History of Present Illness Antonio Blake is a 73 y.o. male with a past medical history of essential tremor, hyperlipidemia, osteoporosis, skin cancer, internal hemorrhoids, and recent diagnoses of atrial fibrillation, who is here today for follow-up.   Antonio Blake reported no prior cardiac history.  He reported had an extensive cardiac workup many years ago in [place]  after he was thought to have a myocardial infarction.  All cardiac testing at that time came back unremarkable per the patient.  He has known history of essential tremor has been on propranolol for that since around 2006 with good control.  He had no history of hypertension, diabetes, heart failure or stroke.  He does have a longstanding history of hyperlipidemia on rosuvastatin.  He is not aware of any history of an arrhythmia.  He was previously evaluated by GI during his procedure he was noted to be tachycardic was given esmolol  and 1 dose of IV metoprolol .  Unfortunately not taking his a.m. dose of propranolol.  An EKG was performed and was confirmed he was in atrial fibrillation.  He was asymptomatic.   He was last seen in clinic 03/04/2024 for overall he is doing well from a cardiac perspective.  He denies chest pain, palpitations, lightheadedness or dizziness.  He was having some shortness of breath with minimal exertion and mild swelling to his bilateral lower extremities.  He was started on apixaban  5 mg twice daily and continued on his propranolol that was previously prescribed for his essential tremor.  He was also scheduled for an updated echocardiogram.  He returns to clinic today stating that he has been doing well.  He denies any chest pain, palpitations, lightheadedness or dizziness.  Occasionally has shortness of breath with minimal exertion mild  swelling to his bilateral lower extremities is unchanged from his previous visit.  He does not have any symptoms related to his atrial fibrillation.  Unfortunately he did have to have a tooth extracted on Friday and was advised it was okay to hold a dose of his apixaban .  So he missed 1 dose of Eliquis  since he has been started.  He denies any bleeding with no blood noted in his urine or stool.  States that he has been compliant with the remainder of his medication regimen with no undue side effects.  Denies any hospitalizations or visits to the emergency department.  ROS: 10 point review of system has been reviewed and considered negative except ones been listed in the HPI  Studies Reviewed EKG Interpretation Date/Time:  Tuesday March 29 2024 08:25:10 EDT Ventricular Rate:  104 PR Interval:    QRS Duration:  78 QT Interval:  272 QTC Calculation: 357 R Axis:   -15  Text Interpretation: Atrial fibrillation with rapid ventricular response When compared with ECG of 04-Mar-2024 15:07, QT has shortened Confirmed by [name] ([id number]) on 03/29/2024 8:34:12 AM    2d echo 03/07/2024 1. Left ventricular ejection fraction, by estimation, is 60 to 65%. The  left ventricle has normal function. The left ventricle has no regional  wall motion abnormalities. There is mild left ventricular hypertrophy.  Left ventricular diastolic parameters  are indeterminate.   2. Right ventricular systolic function is normal. The right ventricular  size is normal.   3. The mitral valve is normal in structure. Mild mitral valve  regurgitation.   4. The aortic valve is tricuspid. Aortic valve regurgitation is not  visualized.   5. Aortic dilatation noted. There is mild dilatation of the aortic root,  measuring 41 mm. There is borderline dilatation of the ascending aorta,  measuring 38 mm.   6. The inferior vena cava is normal in size with greater than 50%  respiratory variability, suggesting right atrial pressure of  3 mmHg.   Risk Assessment/Calculations  CHA2DS2-VASc Score = 1   This indicates a 0.6% annual risk of stroke. The patient's score is based upon: CHF History: 0 HTN History: 0 Diabetes History: 0 Stroke History: 0 Vascular Disease History: 0 Age Score: 1 Gender Score: 0             Physical Exam VS:  BP 114/72   Pulse (!) 104   Ht 6' 1 (1.854 m)   Wt 275 lb (124.7 kg)   SpO2 94%   BMI 36.28 kg/m        Wt Readings from Last 3 Encounters:  03/29/24 275 lb (124.7 kg)  03/04/24 280 lb (127 kg)  03/02/24 272 lb 12.8 oz (123.7 kg)    GEN: Well nourished, well developed in no acute distress NECK: No JVD; No carotid bruits CARDIAC: IR IR, no murmurs, rubs, gallops RESPIRATORY:  Clear to auscultation without rales, wheezing or rhonchi  ABDOMEN: Soft, non-tender, non-distended EXTREMITIES: Trace pretibial edema; No deformity   ASSESSMENT AND PLAN Paroxysmal atrial fibrillation that was previously found incidentally during a colonoscopy.  EKG today reveals rate controlled atrial fibrillation 104.  He has been continued on apixaban  5 mg twice daily for CHA2DS2-VASc score of at least 1 for stroke prophylaxis.  Unfortunately he missed 1 dose on Friday to have a tooth pulled and his apixaban  he will he is being scheduled for a direct-current cardioversion in the next 3 to 4 weeks and has been advised taking no missed doses of his blood thinning medication.  He is continued on propranolol 80 mg daily as he is not rate controlled and he has been on this long-term for his tremor.  Recent echocardiogram was completed that revealed an LVEF of 60 to 65%, no RWMA, mild LVH, right ventricular systolic function was normal and normal in size was, there was mild leakage in the mitral valve and borderline mild dilatation of the aortic root.  Mixed hyperlipidemia with last LDL 73.  He is continued on rosuvastatin 10 mg daily with ongoing management per his PCP.  Essential tremor that has been  effectively managed with propranolol 80 mg daily.  Since atrial fibrillation has remained rate controlled he has been continued on this medication at this time.    Informed Consent   Shared Decision Making/Informed Consent The risks (stroke, cardiac arrhythmias rarely resulting in the need for a temporary or permanent pacemaker, skin irritation or burns and complications associated with conscious sedation including aspiration, arrhythmia, respiratory failure and death), benefits (restoration of normal sinus rhythm) and alternatives of a direct current cardioversion were explained in detail to Antonio Blake and he agrees to proceed.       Dispo: Patient to return to clinic to see MD/APP 2 to 3 weeks postprocedure or sooner if needed  Signed, Leen Tworek, NP

## 2024-03-29 NOTE — Patient Instructions (Signed)
 Medication Instructions:  Your physician recommends that you continue on your current medications as directed. Please refer to the Current Medication list given to you today.   *If you need a refill on your cardiac medications before your next appointment, please call your pharmacy*  Lab Work: Your provider would like for you to return the week prior to procedure to have the following labs drawn: CBC, BMP.   Please go to [facility] [address] ([facility]) [address], [place] [address] You do not need an appointment.  They are open from 8 am- 4:30 pm.  Lunch from 1:00 pm- 2:00 pm You DO NOT need to be fasting.   You may also go to one of the following [facility]:  [address] [place], [place] [address] Phone: [phone] Lab hours: Mon-Fri 8 am- 5 pm    Lunch 12 pm- 1 pm  [address] [place],  [place]  [address]  [place]  Phone: [phone] Lab hours: 7 am- 4 pm Lunch 12 pm-1 pm   [address] [place],  [place]  [address]  [place]  Phone: [phone] Lab hours: Mon-Fri 8 am- 5 pm    Lunch 12 pm- 1 pm  If you have labs (blood work) drawn today and your tests are completely normal, you will receive your results only by: MyChart Message (if you have MyChart) OR A paper copy in the mail If you have any lab test that is abnormal or we need to change your treatment, we will call you to review the results.  Testing/Procedures:     Dear Antonio Blake  You are scheduled for a Cardioversion on Monday, July 21 with Dr. Gollan.  Please arrive at the [facility] Entrance of [facility], [address], [place] [address] at 6:30 AM (This is 1 hour(s) prior to your procedure time).  Proceed to the Check-In Desk directly inside the entrance.  Procedure Parking: Use the entrance off of the [address] side of the hospital. Turn right upon entering and follow the driveway to parking that is directly in front of the [facility]ascular Center. There is no valet  parking available at this entrance, however there is an awning directly in front of the [facility] Vascular Center for drop off/ pick up for patients.  DIET:  Nothing to eat or drink after midnight except a sip of water with medications (see medication instructions below)  MEDICATION INSTRUCTIONS: !!IF ANY NEW MEDICATIONS ARE STARTED AFTER TODAY, PLEASE NOTIFY YOUR PROVIDER AS SOON AS POSSIBLE!!  FYI: Medications such as Semaglutide (Ozempic, [place]), Tirzepatide (Mounjaro, Zepbound), Dulaglutide (Trulicity), etc (GLP1 agonists) AND Canagliflozin (Invokana), Dapagliflozin (Farxiga), Empagliflozin (Jardiance), Ertugliflozin (Steglatro), Bexagliflozin [employer]) or any combination with one of these drugs such as Invokamet (Canagliflozin/Metformin), Synjardy (Empagliflozin/Metformin), etc (SGLT2 inhibitors) must be held around the time of a procedure. This is not a comprehensive list of all of these drugs. Please review all of your medications and talk to your provider if you take any one of these. If you are not sure, ask your provider.  Continue taking your anticoagulant (blood thinner): Apixaban  (Eliquis ).  You will need to continue this after your procedure until you are told by your provider that it is safe to stop.    LABS:  Come to [facility] Friday July 18 for labs  FYI:  For your safety, and to allow us  to monitor your vital signs accurately during the surgery/procedure we request: If you have artificial nails, gel coating, SNS etc,  please have those removed prior to your surgery/procedure. Not having the nail coverings /polish removed may result in cancellation or delay of your surgery/procedure.  Your support person will be asked to wait in the waiting room during your procedure.  It is OK to have someone drop you off and come back when you are ready to be discharged.  You cannot drive after the procedure and will need someone to drive you home.  Bring your insurance cards.  *Special  Note: Every effort is made to have your procedure done on time. Occasionally there are emergencies that occur at the hospital that may cause delays. Please be patient if a delay does occur.      Follow-Up: At [facility], you and your health needs are our priority.  As part of our continuing mission to provide you with exceptional heart care, our providers are all part of one team.  This team includes your primary Cardiologist (physician) and Advanced Practice Providers or APPs (Physician Assistants and Nurse Practitioners) who all work together to provide you with the care you need, when you need it.  Your next appointment:   3 week(s)  Provider:   Tylene Lunch, NP

## 2024-03-29 NOTE — H&P (View-Only) (Signed)
 Cardiology Office Note   Date:  03/29/2024  ID:  Teng Decou, DOB 09-11-1951, MRN [id number] PCP: Sherial Bail, MD  [facility] Providers Cardiologist:  None     History of Present Illness Antonio Blake is a 73 y.o. male with a past medical history of essential tremor, hyperlipidemia, osteoporosis, skin cancer, internal hemorrhoids, and recent diagnoses of atrial fibrillation, who is here today for follow-up.   Antonio Blake reported no prior cardiac history.  He reported had an extensive cardiac workup many years ago in [place]  after he was thought to have a myocardial infarction.  All cardiac testing at that time came back unremarkable per the patient.  He has known history of essential tremor has been on propranolol for that since around 2006 with good control.  He had no history of hypertension, diabetes, heart failure or stroke.  He does have a longstanding history of hyperlipidemia on rosuvastatin.  He is not aware of any history of an arrhythmia.  He was previously evaluated by GI during his procedure he was noted to be tachycardic was given esmolol  and 1 dose of IV metoprolol .  Unfortunately not taking his a.m. dose of propranolol.  An EKG was performed and was confirmed he was in atrial fibrillation.  He was asymptomatic.   He was last seen in clinic 03/04/2024 for overall he is doing well from a cardiac perspective.  He denies chest pain, palpitations, lightheadedness or dizziness.  He was having some shortness of breath with minimal exertion and mild swelling to his bilateral lower extremities.  He was started on apixaban  5 mg twice daily and continued on his propranolol that was previously prescribed for his essential tremor.  He was also scheduled for an updated echocardiogram.  He returns to clinic today stating that he has been doing well.  He denies any chest pain, palpitations, lightheadedness or dizziness.  Occasionally has shortness of breath with minimal exertion mild  swelling to his bilateral lower extremities is unchanged from his previous visit.  He does not have any symptoms related to his atrial fibrillation.  Unfortunately he did have to have a tooth extracted on Friday and was advised it was okay to hold a dose of his apixaban .  So he missed 1 dose of Eliquis  since he has been started.  He denies any bleeding with no blood noted in his urine or stool.  States that he has been compliant with the remainder of his medication regimen with no undue side effects.  Denies any hospitalizations or visits to the emergency department.  ROS: 10 point review of system has been reviewed and considered negative except ones been listed in the HPI  Studies Reviewed EKG Interpretation Date/Time:  Tuesday March 29 2024 08:25:10 EDT Ventricular Rate:  104 PR Interval:    QRS Duration:  78 QT Interval:  272 QTC Calculation: 357 R Axis:   -15  Text Interpretation: Atrial fibrillation with rapid ventricular response When compared with ECG of 04-Mar-2024 15:07, QT has shortened Confirmed by [name] ([id number]) on 03/29/2024 8:34:12 AM    2d echo 03/07/2024 1. Left ventricular ejection fraction, by estimation, is 60 to 65%. The  left ventricle has normal function. The left ventricle has no regional  wall motion abnormalities. There is mild left ventricular hypertrophy.  Left ventricular diastolic parameters  are indeterminate.   2. Right ventricular systolic function is normal. The right ventricular  size is normal.   3. The mitral valve is normal in structure. Mild mitral valve  regurgitation.   4. The aortic valve is tricuspid. Aortic valve regurgitation is not  visualized.   5. Aortic dilatation noted. There is mild dilatation of the aortic root,  measuring 41 mm. There is borderline dilatation of the ascending aorta,  measuring 38 mm.   6. The inferior vena cava is normal in size with greater than 50%  respiratory variability, suggesting right atrial pressure of  3 mmHg.   Risk Assessment/Calculations  CHA2DS2-VASc Score = 1   This indicates a 0.6% annual risk of stroke. The patient's score is based upon: CHF History: 0 HTN History: 0 Diabetes History: 0 Stroke History: 0 Vascular Disease History: 0 Age Score: 1 Gender Score: 0             Physical Exam VS:  BP 114/72   Pulse (!) 104   Ht 6' 1 (1.854 m)   Wt 275 lb (124.7 kg)   SpO2 94%   BMI 36.28 kg/m        Wt Readings from Last 3 Encounters:  03/29/24 275 lb (124.7 kg)  03/04/24 280 lb (127 kg)  03/02/24 272 lb 12.8 oz (123.7 kg)    GEN: Well nourished, well developed in no acute distress NECK: No JVD; No carotid bruits CARDIAC: IR IR, no murmurs, rubs, gallops RESPIRATORY:  Clear to auscultation without rales, wheezing or rhonchi  ABDOMEN: Soft, non-tender, non-distended EXTREMITIES: Trace pretibial edema; No deformity   ASSESSMENT AND PLAN Paroxysmal atrial fibrillation that was previously found incidentally during a colonoscopy.  EKG today reveals rate controlled atrial fibrillation 104.  He has been continued on apixaban  5 mg twice daily for CHA2DS2-VASc score of at least 1 for stroke prophylaxis.  Unfortunately he missed 1 dose on Friday to have a tooth pulled and his apixaban  he will he is being scheduled for a direct-current cardioversion in the next 3 to 4 weeks and has been advised taking no missed doses of his blood thinning medication.  He is continued on propranolol 80 mg daily as he is not rate controlled and he has been on this long-term for his tremor.  Recent echocardiogram was completed that revealed an LVEF of 60 to 65%, no RWMA, mild LVH, right ventricular systolic function was normal and normal in size was, there was mild leakage in the mitral valve and borderline mild dilatation of the aortic root.  Mixed hyperlipidemia with last LDL 73.  He is continued on rosuvastatin 10 mg daily with ongoing management per his PCP.  Essential tremor that has been  effectively managed with propranolol 80 mg daily.  Since atrial fibrillation has remained rate controlled he has been continued on this medication at this time.    Informed Consent   Shared Decision Making/Informed Consent The risks (stroke, cardiac arrhythmias rarely resulting in the need for a temporary or permanent pacemaker, skin irritation or burns and complications associated with conscious sedation including aspiration, arrhythmia, respiratory failure and death), benefits (restoration of normal sinus rhythm) and alternatives of a direct current cardioversion were explained in detail to Antonio Blake and he agrees to proceed.       Dispo: Patient to return to clinic to see MD/APP 2 to 3 weeks postprocedure or sooner if needed  Signed, Leen Tworek, NP

## 2024-04-22 ENCOUNTER — Other Ambulatory Visit
Admission: RE | Admit: 2024-04-22 | Discharge: 2024-04-22 | Disposition: A | Discharge status: Home / Self Care | Attending: Cardiology | Admitting: Cardiology

## 2024-04-22 ENCOUNTER — Telehealth: Payer: Self-pay | Admitting: *Deleted

## 2024-04-22 ENCOUNTER — Ambulatory Visit: Payer: Self-pay | Admitting: Medical

## 2024-04-22 DIAGNOSIS — I4891 Unspecified atrial fibrillation: Secondary | ICD-10-CM | POA: Diagnosis present

## 2024-04-22 LAB — CBC
HCT: 42.2 % (ref 39.0–52.0)
Hemoglobin: 14.3 g/dL (ref 13.0–17.0)
MCH: 31.9 pg (ref 26.0–34.0)
MCHC: 33.9 g/dL (ref 30.0–36.0)
MCV: 94.2 fL (ref 80.0–100.0)
Platelets: 259 K/uL (ref 150–400)
RBC: 4.48 MIL/uL (ref 4.22–5.81)
RDW: 13.4 % (ref 11.5–15.5)
WBC: 6.9 K/uL (ref 4.0–10.5)
nRBC: 0 % (ref 0.0–0.2)

## 2024-04-22 LAB — BASIC METABOLIC PANEL WITH GFR
Anion gap: 7 (ref 5–15)
BUN: 21 mg/dL (ref 8–23)
CO2: 26 mmol/L (ref 22–32)
Calcium: 9.4 mg/dL (ref 8.9–10.3)
Chloride: 103 mmol/L (ref 98–111)
Creatinine, Ser: 1.1 mg/dL (ref 0.61–1.24)
GFR, Estimated: >60 mL/min (ref >60)
Glucose, Bld: 114 mg/dL — ABNORMAL HIGH (ref 70–99)
Potassium: 4.9 mmol/L (ref 3.5–5.1)
Sodium: 136 mmol/L (ref 135–145)

## 2024-04-22 NOTE — Telephone Encounter (Signed)
 Called to confirm/remind patient of their procedure.   Scheduled for: DCCV  []  Date 04/25/24  []  Time 07:30 am []  Arrival time 06:30 am []  Location [facility]   []  Designated Driver  []  Instructions, time, and location reviewed with patient    [x]  H&P within 30 days  [x]  EKG within 30 days  [x]  Orders  []  Labs  []  Diet  []  Medication instructions reviewed   []  Spoke with patient and reviewed all information [x]  Left voicemail message to call back for review of instructions.   []  Phone not in service

## 2024-04-22 NOTE — Addendum Note (Signed)
 Addended by: ALEX SLOUGH C on: 04/22/2024 08:14 AM   Modules accepted: Orders

## 2024-04-22 NOTE — Telephone Encounter (Signed)
 Patient returned call and all information reviewed. No further questions.     Scheduled for: DCCV             [x]  Date 04/25/24             [x]  Time 07:30 am [x]  Arrival time 06:30 am [x]  Location [facility]                  [x]  Designated Driver             [x]  Instructions, time, and location reviewed with patient                [x]  H&P within 30 days             [x]  EKG within 30 days             [x]  Orders             [x]  Labs             [x]  Diet             [x]  Medication instructions reviewed     [x]  Spoke with patient and reviewed all information []  Left voicemail message to call back for review of instructions.              []  Phone not in service

## 2024-04-25 ENCOUNTER — Ambulatory Visit
Admission: RE | Admit: 2024-04-25 | Discharge: 2024-04-25 | Disposition: A | Discharge status: Home / Self Care | Attending: Cardiovascular Disease | Admitting: Cardiovascular Disease

## 2024-04-25 ENCOUNTER — Encounter
Admission: RE | Disposition: A | Discharge status: Home / Self Care | Payer: Self-pay | Source: Home / Self Care | Attending: Cardiovascular Disease

## 2024-04-25 ENCOUNTER — Encounter: Payer: Self-pay | Admitting: Cardiovascular Disease

## 2024-04-25 ENCOUNTER — Ambulatory Visit: Admitting: Anesthesiology

## 2024-04-25 DIAGNOSIS — I480 Paroxysmal atrial fibrillation: Secondary | ICD-10-CM | POA: Diagnosis not present

## 2024-04-25 DIAGNOSIS — Z6836 Body mass index (BMI) 36.0-36.9, adult: Secondary | ICD-10-CM | POA: Diagnosis not present

## 2024-04-25 DIAGNOSIS — E669 Obesity, unspecified: Secondary | ICD-10-CM | POA: Insufficient documentation

## 2024-04-25 DIAGNOSIS — Z7901 Long term (current) use of anticoagulants: Secondary | ICD-10-CM | POA: Insufficient documentation

## 2024-04-25 DIAGNOSIS — Z87891 Personal history of nicotine dependence: Secondary | ICD-10-CM | POA: Diagnosis not present

## 2024-04-25 DIAGNOSIS — I491 Atrial premature depolarization: Secondary | ICD-10-CM

## 2024-04-25 DIAGNOSIS — I4891 Unspecified atrial fibrillation: Secondary | ICD-10-CM

## 2024-04-25 DIAGNOSIS — R0602 Shortness of breath: Secondary | ICD-10-CM

## 2024-04-25 DIAGNOSIS — I4819 Other persistent atrial fibrillation: Secondary | ICD-10-CM

## 2024-04-25 HISTORY — PX: CARDIOVERSION: SHX1299

## 2024-04-25 HISTORY — DX: Cardiac arrhythmia, unspecified: I49.9

## 2024-04-25 SURGERY — CARDIOVERSION
Anesthesia: General

## 2024-04-25 MED ORDER — SODIUM CHLORIDE 0.9% FLUSH: 3.0000 mL | INTRAVENOUS | Status: DC | PRN

## 2024-04-25 MED ORDER — PROPOFOL 10 MG/ML IV BOLUS
INTRAVENOUS | Status: AC
  Filled 2024-04-25: qty 20

## 2024-04-25 MED ORDER — SODIUM CHLORIDE 0.9% FLUSH: 3.0000 mL | Freq: Two times a day (BID) | INTRAVENOUS | Status: DC

## 2024-04-25 MED ORDER — SODIUM CHLORIDE 0.9 % IV SOLN: INTRAVENOUS | Status: DC | PRN

## 2024-04-25 MED ORDER — PROPOFOL 10 MG/ML IV BOLUS
INTRAVENOUS | Status: DC | PRN
  Administered 2024-04-25: 80 mg via INTRAVENOUS

## 2024-04-25 NOTE — Interval H&P Note (Signed)
 History and Physical Interval Note:  04/25/2024 2:10 PM  Antonio Blake  has presented today for surgery, with the diagnosis of Cardioversion   Afib.  The various methods of treatment have been discussed with the patient and family. After consideration of risks, benefits and other options for treatment, the patient has consented to  Procedure(s): CARDIOVERSION (N/A) as a surgical intervention.  The patient's history has been reviewed, patient examined, no change in status, stable for surgery.  I have reviewed the patient's chart and labs.  Questions were answered to the patient's satisfaction.     Raisha Brabender

## 2024-04-25 NOTE — Transfer of Care (Signed)
 Immediate Anesthesia Transfer of Care Note  Patient: Antonio Blake  Procedure(s) Performed: CARDIOVERSION  Patient Location: Short Stay  Anesthesia Type:General  Level of Consciousness: drowsy  Airway & Oxygen Therapy: Patient Spontanous Breathing and Patient connected to nasal cannula oxygen  Post-op Assessment: Report given to RN and Post -op Vital signs reviewed and stable  Post vital signs: Reviewed and stable  Last Vitals:  Vitals Value Taken Time  BP 126/91 04/25/24 07:33  Temp    Pulse 67 04/25/24 07:36  Resp 22 04/25/24 07:36  SpO2 98 % 04/25/24 07:36    Last Pain:  Vitals:   04/25/24 0703  TempSrc: Oral  PainSc: 0-No pain         Complications: No notable events documented.

## 2024-04-25 NOTE — CV Procedure (Signed)
 Cardioversion procedure note For atrial fibrillation, persistent.  Procedure Details:  Consent: Risks of procedure as well as the alternatives and risks of each were explained to the (patient/caregiver).  Consent for procedure obtained.  Time Out: Verified patient identification, verified procedure, site/side was marked, verified correct patient position, special equipment/implants available, medications/allergies/relevent history reviewed, required imaging and test results available.  Performed  Patient placed on cardiac monitor, pulse oximetry, supplemental oxygen as necessary.   Sedation given: propofol IV, Dr. Ronni Rumble Pacer pads placed anterior and posterior chest.   Cardioverted 1 time(s).   Cardioverted at  150 J. Synchronized biphasic Converted to NSR   Evaluation: Findings: Post procedure EKG shows: NSR Complications: None Patient did tolerate procedure well.  Time Spent Directly with the Patient:  45 minutes   Dossie Arbour, M.D., Ph.D.

## 2024-04-25 NOTE — Anesthesia Preprocedure Evaluation (Addendum)
 Anesthesia Evaluation  Patient identified by MRN, date of birth, ID band Patient awake    Reviewed: Allergy & Precautions, NPO status , Patient's Chart, lab work & pertinent test results  History of Anesthesia Complications Negative for: history of anesthetic complications  Airway Mallampati: III   Neck ROM: Full    Dental  (+) Missing   Pulmonary Current Smoker (occasional cigar; none in months) and Patient abstained from smoking.   Pulmonary exam normal breath sounds clear to auscultation       Cardiovascular + dysrhythmias (a fib on Eliquis )  Rhythm:Irregular Rate:Normal  Echo 03/07/24:  1. Left ventricular ejection fraction, by estimation, is 60 to 65%. The left ventricle has normal function. The left ventricle has no regional wall motion abnormalities. There is mild left ventricular hypertrophy. Left ventricular diastolic parameters are indeterminate.   2. Right ventricular systolic function is normal. The right ventricular size is normal.   3. The mitral valve is normal in structure. Mild mitral valve regurgitation.   4. The aortic valve is tricuspid. Aortic valve regurgitation is not visualized.   5. Aortic dilatation noted. There is mild dilatation of the aortic root, measuring 41 mm. There is borderline dilatation of the ascending aorta, measuring 38 mm.   6. The inferior vena cava is normal in size with greater than 50% respiratory variability, suggesting right atrial pressure of 3 mmHg.     Neuro/Psych Essential tremor on propranolol    GI/Hepatic negative GI ROS,,,  Endo/Other  Obesity   Renal/GU negative Renal ROS     Musculoskeletal   Abdominal   Peds  Hematology negative hematology ROS (+)   Anesthesia Other Findings   Reproductive/Obstetrics                              Anesthesia Physical Anesthesia Plan  ASA: 3  Anesthesia Plan: General   Post-op Pain Management:     Induction: Intravenous  PONV Risk Score and Plan: 1 and Propofol  infusion, TIVA and Treatment may vary due to age or medical condition  Airway Management Planned: Natural Airway  Additional Equipment:   Intra-op Plan:   Post-operative Plan:   Informed Consent: I have reviewed the patients History and Physical, chart, labs and discussed the procedure including the risks, benefits and alternatives for the proposed anesthesia with the patient or authorized representative who has indicated his/her understanding and acceptance.       Plan Discussed with: CRNA  Anesthesia Plan Comments: (LMA/GETA backup discussed.  Patient consented for risks of anesthesia including but not limited to:  - adverse reactions to medications - damage to eyes, teeth, lips or other oral mucosa - nerve damage due to positioning  - sore throat or hoarseness - damage to heart, brain, nerves, lungs, other parts of body or loss of life  Informed patient about role of CRNA in peri- and intra-operative care.  Patient voiced understanding.)         Anesthesia Quick Evaluation

## 2024-04-25 NOTE — Anesthesia Postprocedure Evaluation (Signed)
 Anesthesia Post Note  Patient: Rosalie Gelpi  Procedure(s) Performed: CARDIOVERSION  Patient location during evaluation: PACU Anesthesia Type: General Level of consciousness: awake and alert, oriented and patient cooperative Pain management: pain level controlled Vital Signs Assessment: post-procedure vital signs reviewed and stable Respiratory status: spontaneous breathing, nonlabored ventilation and respiratory function stable Cardiovascular status: blood pressure returned to baseline and stable Postop Assessment: adequate PO intake Anesthetic complications: no   No notable events documented.   Last Vitals:  Vitals:   04/25/24 0800 04/25/24 0815  BP: 112/76 109/69  Pulse: 66 65  Resp: 18 17  Temp:    SpO2: 97% 97%    Last Pain:  Vitals:   04/25/24 0815  TempSrc:   PainSc: 0-No pain                 Elin Fenley

## 2024-04-25 NOTE — H&P (Signed)

## 2024-04-26 ENCOUNTER — Encounter: Payer: Self-pay | Admitting: Cardiovascular Disease

## 2024-05-10 NOTE — Progress Notes (Unsigned)
 Cardiology Office Note   Date:  05/11/2024  ID:  Antonio Blake, DOB 1950-12-10, MRN [id number] PCP: Antonio Bail, MD  [facility] Providers Cardiologist:  None Cardiology APP:  [name], NP     History of Present Illness Antonio Blake is a 73 y.o. male with past medical history of essential tremor, hyperlipidemia, osteoporosis, skin cancer, internal hemorrhoids, and new onset paroxysmal atrial fibrillation, who is here today for follow-up.   [name] reported no prior cardiac history.  He reported had an extensive cardiac workup many years ago in [place]  after he was thought to have a myocardial infarction.  All cardiac testing at that time came back unremarkable per the patient.  He has known history of essential tremor has been on propranolol for that since around 2006 with good control.  He had no history of hypertension, diabetes, heart failure or stroke.  He does have a longstanding history of hyperlipidemia on rosuvastatin.  He is not aware of any history of an arrhythmia.  He was previously evaluated by GI during his procedure he was noted to be tachycardic was given esmolol  and 1 dose of IV metoprolol .  Unfortunately not taking his a.m. dose of propranolol.  An EKG was performed and was confirmed he was in atrial fibrillation.  He was asymptomatic.  He was seen in clinic 03/04/2024 overall doing well from a cardiac perspective.  Denied any symptoms of angina or anginal equivalents.  Had been taking apixaban  5 mg twice daily without any issues of bleeding.  He was scheduled for an updated echocardiogram.   He was last seen in clinic 03/25/2024 doing well from the cardiac perspective.  Occasional shortness of breath with minimal exertion and mild swelling to his bilateral lower extremities exchanged from prior visits.  Had missed 1 dose of apixaban  due to having to have a tooth extracted.  Denied any issues with bleeding with no blood noted in urine or stool.  He was  scheduled for a direct-current cardioversion within 3 weeks and advised to medicine apixaban .  He underwent direct-current cardioversion on 04/25/2024 and was shocked with 150 J times once and converted to normal sinus rhythm with no immediate adverse side effects postprocedure.  He returns to clinic today stating overall from a cardiac perspective he has been doing well.  He has noted that since being back in sinus rhythm his shortness of breath has improved.  He has been able to walk to the end of his driveway and get his trash cans without becoming winded.  He also stated that on the way in this morning he was not short of breath which is a huge feet for him.  He states that he has been compliant with his current medication regimen without any undue side effects.  States that he has not missed any doses of his apixaban .  Denies any bleeding with no blood noted in his urine or stool.  Denies any hospitalizations or visits to the emergency department.  ROS: 10 point review of system has been reviewed and considered negative except ones been listed in the HPI  Studies Reviewed EKG Interpretation Date/Time:  Wednesday May 11 2024 08:18:44 EDT Ventricular Rate:  63 PR Interval:  154 QRS Duration:  76 QT Interval:  386 QTC Calculation: 395 R Axis:   -7  Text Interpretation: Sinus rhythm with occasional Premature ventricular complexes When compared with ECG of 25-Apr-2024 07:35, PREVIOUS ECG IS PRESENT Confirmed by [name] ([id number]) on 05/11/2024 8:23:08 AM  2d echo 03/07/2024 1. Left ventricular ejection fraction, by estimation, is 60 to 65%. The  left ventricle has normal function. The left ventricle has no regional  wall motion abnormalities. There is mild left ventricular hypertrophy.  Left ventricular diastolic parameters  are indeterminate.   2. Right ventricular systolic function is normal. The right ventricular  size is normal.   3. The mitral valve is normal in structure. Mild  mitral valve  regurgitation.   4. The aortic valve is tricuspid. Aortic valve regurgitation is not  visualized.   5. Aortic dilatation noted. There is mild dilatation of the aortic root,  measuring 41 mm. There is borderline dilatation of the ascending aorta,  measuring 38 mm.   6. The inferior vena cava is normal in size with greater than 50%  respiratory variability, suggesting right atrial pressure of 3 mmHg.  Risk Assessment/Calculations  CHA2DS2-VASc Score = 1   This indicates a 0.6% annual risk of stroke. The patient's score is based upon: CHF History: 0 HTN History: 0 Diabetes History: 0 Stroke History: 0 Vascular Disease History: 0 Age Score: 1 Gender Score: 0             Physical Exam VS:  BP 120/70 (BP Location: Left Arm, Patient Position: Sitting, Cuff Size: Large)   Pulse 63   Ht 6' 1 (1.854 m)   Wt 275 lb 12.8 oz (125.1 kg)   SpO2 99%   BMI 36.39 kg/m        Wt Readings from Last 3 Encounters:  05/11/24 275 lb 12.8 oz (125.1 kg)  04/25/24 275 lb (124.7 kg)  03/29/24 275 lb (124.7 kg)    GEN: Well nourished, well developed in no acute distress NECK: No JVD; No carotid bruits CARDIAC: RRR, no murmurs, rubs, gallops RESPIRATORY:  Clear to auscultation without rales, wheezing or rhonchi  ABDOMEN: Soft, non-tender, non-distended EXTREMITIES: Trace pretibial edema; No deformity; varicose veins CEAP class III  ASSESSMENT AND PLAN Paroxysmal atrial fibrillation previously found incidentally during a colonoscopy.  EKG today reveals sinus rhythm with a rate of 63 with occasional unifocal PVCs.  He underwent successful cardioversion with 1 shock and 150 J converting to sinus rhythm.  He has been continued on apixaban  5 mg twice daily and propranolol 80 mg daily.  Mixed hyperlipidemia with last LDL 73.  He is continued on rosuvastatin 10 mg daily with ongoing management per his PCP.  Essential tremor has been effectively managed with propranolol 80 mg daily.   Since atrial fibrillation has remained rate controlled and he is now in sinus rhythm he has been continued on his propranolol at this time.  PVCs noted on EKG today where he is being sent for an updated BMP to evaluate potassium.       Dispo: Patient to return to clinic to see MD/APP in 3 months or sooner if needed for further evaluation.  Signed, Lenvil Swaim, NP

## 2024-05-11 ENCOUNTER — Ambulatory Visit: Attending: Cardiology | Admitting: Cardiology

## 2024-05-11 ENCOUNTER — Encounter: Payer: Self-pay | Admitting: Cardiology

## 2024-05-11 VITALS — BP 120/70 | HR 63 | Ht 73.0 in | Wt 275.8 lb

## 2024-05-11 DIAGNOSIS — E782 Mixed hyperlipidemia: Secondary | ICD-10-CM | POA: Insufficient documentation

## 2024-05-11 DIAGNOSIS — I480 Paroxysmal atrial fibrillation: Secondary | ICD-10-CM | POA: Diagnosis present

## 2024-05-11 DIAGNOSIS — I493 Ventricular premature depolarization: Secondary | ICD-10-CM | POA: Diagnosis present

## 2024-05-11 DIAGNOSIS — Z79899 Other long term (current) drug therapy: Secondary | ICD-10-CM | POA: Diagnosis present

## 2024-05-11 DIAGNOSIS — G250 Essential tremor: Secondary | ICD-10-CM | POA: Diagnosis present

## 2024-05-11 NOTE — Patient Instructions (Signed)
 Medication Instructions:  Your physician recommends that you continue on your current medications as directed. Please refer to the Current Medication list given to you today.   *If you need a refill on your cardiac medications before your next appointment, please call your pharmacy*  Lab Work: Your provider would like for you to have following labs drawn today BMP.   If you have labs (blood work) drawn today and your tests are completely normal, you will receive your results only by: MyChart Message (if you have MyChart) OR A paper copy in the mail If you have any lab test that is abnormal or we need to change your treatment, we will call you to review the results.  Testing/Procedures: No test ordered today   Follow-Up: At [facility], you and your health needs are our priority.  As part of our continuing mission to provide you with exceptional heart care, our providers are all part of one team.  This team includes your primary Cardiologist (physician) and Advanced Practice Providers or APPs (Physician Assistants and Nurse Practitioners) who all work together to provide you with the care you need, when you need it.  Your next appointment:   3 month(s)  Provider:   Tylene Lunch, NP

## 2024-05-12 ENCOUNTER — Ambulatory Visit: Payer: Self-pay | Admitting: Cardiology

## 2024-05-12 LAB — BASIC METABOLIC PANEL WITH GFR
BUN/Creatinine Ratio: 15 (ref 10–24)
BUN: 12 mg/dL (ref 8–27)
CO2: 23 mmol/L (ref 20–29)
Calcium: 9.2 mg/dL (ref 8.6–10.2)
Chloride: 100 mmol/L (ref 96–106)
Creatinine, Ser: 0.8 mg/dL (ref 0.76–1.27)
Glucose: 89 mg/dL (ref 70–99)
Potassium: 4.7 mmol/L (ref 3.5–5.2)
Sodium: 135 mmol/L (ref 134–144)
eGFR: 95 mL/min/1.73 (ref >59)

## 2024-05-12 NOTE — Progress Notes (Signed)
 Blood work remains stable.  No changes to current medication regimen needed at this time.

## 2024-08-11 ENCOUNTER — Ambulatory Visit: Admitting: Cardiology

## 2024-08-19 ENCOUNTER — Ambulatory Visit: Attending: Cardiology | Admitting: Cardiology

## 2024-08-19 ENCOUNTER — Encounter: Payer: Self-pay | Admitting: Cardiology

## 2024-08-19 VITALS — BP 100/70 | HR 69 | Ht 73.0 in | Wt 276.2 lb

## 2024-08-19 DIAGNOSIS — G4733 Obstructive sleep apnea (adult) (pediatric): Secondary | ICD-10-CM | POA: Diagnosis present

## 2024-08-19 DIAGNOSIS — E782 Mixed hyperlipidemia: Secondary | ICD-10-CM | POA: Diagnosis present

## 2024-08-19 DIAGNOSIS — I480 Paroxysmal atrial fibrillation: Secondary | ICD-10-CM | POA: Insufficient documentation

## 2024-08-19 DIAGNOSIS — G250 Essential tremor: Secondary | ICD-10-CM | POA: Diagnosis present

## 2024-08-19 NOTE — Patient Instructions (Signed)
 Medication Instructions:  Your physician recommends that you continue on your current medications as directed. Please refer to the Current Medication list given to you today.   *If you need a refill on your cardiac medications before your next appointment, please call your pharmacy*  Lab Work: No labs ordered today  If you have labs (blood work) drawn today and your tests are completely normal, you will receive your results only by: MyChart Message (if you have MyChart) OR A paper copy in the mail If you have any lab test that is abnormal or we need to change your treatment, we will call you to review the results.  Testing/Procedures: No test ordered today   Follow-Up: At [facility] you and your health needs are our priority.  As part of our continuing mission to provide you with exceptional heart care, our providers are all part of one team.  This team includes your primary Cardiologist (physician) and Advanced Practice Providers or APPs (Physician Assistants and Nurse Practitioners) who all work together to provide you with the care you need, when you need it.  Your next appointment:   6 month(s)  Provider:   You may see one of the following Advanced Practice Providers on your designated Care Team:   Tylene Lunch, NP

## 2024-08-19 NOTE — Progress Notes (Signed)
 Cardiology Office Note   Date:  08/19/2024  ID:  Antonio Blake, DOB 09/26/1951, MRN [id number] PCP: Sherial Bail, MD  [facility] Providers Cardiologist:  Deatrice Cage, MD Cardiology APP:  [name], NP     History of Present Illness Antonio Blake is a 73 y.o. male with a past medical history of essential tremor, hyperlipidemia, osteoporosis, skin cancer, internal hemorrhoids, and paroxysmal atrial fibrillation, who is here today for follow-up.   Antonio Blake reported no prior cardiac history.  He reported had an extensive cardiac workup many years ago in [place]  after he was thought to have a myocardial infarction.  All cardiac testing at that time came back unremarkable per the patient.  He has known history of essential tremor has been on propranolol for that since around 2006 with good control.  He had no history of hypertension, diabetes, heart failure or stroke.  He does have a longstanding history of hyperlipidemia on rosuvastatin.  He is not aware of any history of an arrhythmia.  He was previously evaluated by GI during his procedure he was noted to be tachycardic was given esmolol  and 1 dose of IV metoprolol .  Unfortunately not taking his a.m. dose of propranolol.  An EKG was performed and was confirmed he was in atrial fibrillation.  He was asymptomatic.  He was seen in clinic 03/04/2024 overall doing well from a cardiac perspective.  Denied any symptoms of angina or anginal equivalents.  Had been taking apixaban  5 mg twice daily without any issues of bleeding.  He was scheduled for an updated echocardiogram.  Was previously seen in clinic 03/25/2024 doing well from a cardiac perspective.  He had occasional shortness of breath with minimal exertion with mild swelling to his bilateral lower extremities that was unchanged from prior visits.  Unfortunately he had missed 1 dose of apixaban  due to have a tooth extracted.  He was scheduled for cardioversion in 3 weeks and  advised not to miss any doses of apixaban .  He underwent direct-current cardioversion on 04/25/2024 and was shocked 150 J times once converted to normal sinus rhythm with no immediate adverse effects postprocedure.  He was last seen in clinic 05/11/2024 doing well from a cardiac perspective.  He notes since being back in sinus rhythm he shortness of breath and much improved.  He was continued on his current medication regimen and schedule for an updated BMP.    He returns to clinic today stating that he has been doing well from a cardiac perspective.  Denies any chest pain, shortness of breath or palpitations.  Continues to have chronic swelling to his bilateral lower extremities that he wears compression stockings for.  States that he has been compliant with his current medication regimen without any missed doses of his blood thinner.  He denies any bleeding with no blood noted in his urine or stool.  Denies any recent hospitalizations or visits to the emergency department.  ROS: 10 point review of system has been reviewed and considered negative exception was been listed in the HPI  Studies Reviewed EKG Interpretation Date/Time:  Friday August 19 2024 10:51:16 EST Ventricular Rate:  69 PR Interval:  162 QRS Duration:  82 QT Interval:  372 QTC Calculation: 398 R Axis:   -33  Text Interpretation: Normal sinus rhythm Left axis deviation When compared with ECG of 11-May-2024 08:18, No significant change since last tracing Confirmed by [name] ([id number]) on 08/19/2024 11:01:03 AM    2d echo 03/07/2024 1. Left ventricular ejection  fraction, by estimation, is 60 to 65%. The  left ventricle has normal function. The left ventricle has no regional  wall motion abnormalities. There is mild left ventricular hypertrophy.  Left ventricular diastolic parameters  are indeterminate.   2. Right ventricular systolic function is normal. The right ventricular  size is normal.   3. The mitral valve is  normal in structure. Mild mitral valve  regurgitation.   4. The aortic valve is tricuspid. Aortic valve regurgitation is not  visualized.   5. Aortic dilatation noted. There is mild dilatation of the aortic root,  measuring 41 mm. There is borderline dilatation of the ascending aorta,  measuring 38 mm.   6. The inferior vena cava is normal in size with greater than 50%  respiratory variability, suggesting right atrial pressure of 3 mmHg.   Risk Assessment/Calculations  CHA2DS2-VASc Score = 1   This indicates a 0.6% annual risk of stroke. The patient's score is based upon: CHF History: 0 HTN History: 0 Diabetes History: 0 Stroke History: 0 Vascular Disease History: 0 Age Score: 1 Gender Score: 0             Physical Exam VS:  BP 100/70 (BP Location: Left Arm, Patient Position: Sitting, Cuff Size: Large)   Pulse 69 Comment: 80 oximeter  Ht 6' 1 (1.854 m)   Wt 276 lb 3.2 oz (125.3 kg)   SpO2 96%   BMI 36.44 kg/m        Wt Readings from Last 3 Encounters:  08/19/24 276 lb 3.2 oz (125.3 kg)  05/11/24 275 lb 12.8 oz (125.1 kg)  04/25/24 275 lb (124.7 kg)    GEN: Well nourished, well developed in no acute distress NECK: No JVD; No carotid bruits CARDIAC: RRR, no murmurs, rubs, gallops RESPIRATORY:  Clear to auscultation without rales, wheezing or rhonchi  ABDOMEN: Soft, non-tender, non-distended EXTREMITIES: Trace pretibial edema; No deformity, knee-high compression stockings on bilaterally  ASSESSMENT AND PLAN Paroxysmal atrial fibrillation previously found incidentally during a colonoscopy.  EKG today reveals sinus rhythm with rate of 69 with left axis deviation.  He had successful cardioversion previously.  He has continued on propranolol 80 mg daily (as he takes this for longstanding essential tremor) and apixaban  5 mg twice daily.  Mixed hyperlipidemia with last LDL 73.  He is continue rosuvastatin 10 mg daily with ongoing management per his PCP  Essential tremor  that has been effectively managed with propranolol 80 mg daily.  Obstructive sleep apnea where he has been compliant with CPAP.       Dispo: Patient to return to clinic to see MD/APP in 6 months or sooner if needed for further evaluation  Signed, Arad Burston, NP

## 2025-02-16 ENCOUNTER — Ambulatory Visit: Admitting: Cardiology
